# Patient Record
Sex: Female | Born: 1975 | Race: Black or African American | Hispanic: No | Marital: Single | Smoking: Never smoker
Health system: Southern US, Community
[De-identification: ages and names within clinical notes are randomized; demographics above are authoritative.]

## PROBLEM LIST (undated history)

## (undated) ENCOUNTER — Emergency Department (HOSPITAL_COMMUNITY): Admission: EM | Payer: BC Managed Care – PPO | Source: Home / Self Care

## (undated) DIAGNOSIS — J45909 Unspecified asthma, uncomplicated: Secondary | ICD-10-CM

## (undated) DIAGNOSIS — A6000 Herpesviral infection of urogenital system, unspecified: Secondary | ICD-10-CM

## (undated) DIAGNOSIS — I10 Essential (primary) hypertension: Secondary | ICD-10-CM

## (undated) HISTORY — DX: Herpesviral infection of urogenital system, unspecified: A60.00

## (undated) HISTORY — DX: Unspecified asthma, uncomplicated: J45.909

## (undated) HISTORY — DX: Essential (primary) hypertension: I10

---

## 1997-12-01 ENCOUNTER — Emergency Department (HOSPITAL_COMMUNITY): Admission: EM | Admit: 1997-12-01 | Discharge: 1997-12-01 | Payer: Self-pay | Admitting: Emergency Medicine

## 1997-12-11 ENCOUNTER — Emergency Department (HOSPITAL_COMMUNITY): Admission: EM | Admit: 1997-12-11 | Discharge: 1997-12-11 | Payer: Self-pay | Admitting: Emergency Medicine

## 1997-12-12 ENCOUNTER — Inpatient Hospital Stay (HOSPITAL_COMMUNITY): Admission: AD | Admit: 1997-12-12 | Discharge: 1997-12-14 | Payer: Self-pay | Admitting: Obstetrics and Gynecology

## 1998-07-20 ENCOUNTER — Encounter: Payer: Self-pay | Admitting: Emergency Medicine

## 1998-07-20 ENCOUNTER — Emergency Department (HOSPITAL_COMMUNITY): Admission: EM | Admit: 1998-07-20 | Discharge: 1998-07-20 | Payer: Self-pay | Admitting: Emergency Medicine

## 1999-02-24 ENCOUNTER — Inpatient Hospital Stay (HOSPITAL_COMMUNITY): Admission: AD | Admit: 1999-02-24 | Discharge: 1999-02-24 | Payer: Self-pay | Admitting: Obstetrics and Gynecology

## 1999-03-07 ENCOUNTER — Inpatient Hospital Stay (HOSPITAL_COMMUNITY): Admission: AD | Admit: 1999-03-07 | Discharge: 1999-03-07 | Payer: Self-pay | Admitting: Obstetrics and Gynecology

## 1999-07-04 HISTORY — PX: ECTOPIC PREGNANCY SURGERY: SHX613

## 1999-07-08 ENCOUNTER — Emergency Department (HOSPITAL_COMMUNITY): Admission: EM | Admit: 1999-07-08 | Discharge: 1999-07-08 | Payer: Self-pay | Admitting: Emergency Medicine

## 1999-08-26 ENCOUNTER — Observation Stay (HOSPITAL_COMMUNITY): Admission: AD | Admit: 1999-08-26 | Discharge: 1999-08-27 | Payer: Self-pay | Admitting: Obstetrics and Gynecology

## 1999-08-26 ENCOUNTER — Encounter: Payer: Self-pay | Admitting: Obstetrics and Gynecology

## 1999-12-06 ENCOUNTER — Inpatient Hospital Stay (HOSPITAL_COMMUNITY): Admission: AD | Admit: 1999-12-06 | Discharge: 1999-12-06 | Payer: Self-pay | Admitting: Obstetrics and Gynecology

## 2000-02-10 ENCOUNTER — Other Ambulatory Visit: Admission: RE | Admit: 2000-02-10 | Discharge: 2000-02-10 | Payer: Self-pay | Admitting: Obstetrics and Gynecology

## 2000-04-30 ENCOUNTER — Inpatient Hospital Stay (HOSPITAL_COMMUNITY): Admission: AD | Admit: 2000-04-30 | Discharge: 2000-04-30 | Payer: Self-pay | Admitting: Obstetrics and Gynecology

## 2001-06-10 ENCOUNTER — Other Ambulatory Visit: Admission: RE | Admit: 2001-06-10 | Discharge: 2001-06-10 | Payer: Self-pay | Admitting: Obstetrics and Gynecology

## 2001-07-03 HISTORY — PX: FOOT SURGERY: SHX648

## 2001-07-03 HISTORY — PX: BUNIONECTOMY: SHX129

## 2002-06-23 ENCOUNTER — Other Ambulatory Visit: Admission: RE | Admit: 2002-06-23 | Discharge: 2002-06-23 | Payer: Self-pay | Admitting: Obstetrics and Gynecology

## 2003-07-21 ENCOUNTER — Other Ambulatory Visit: Admission: RE | Admit: 2003-07-21 | Discharge: 2003-07-21 | Payer: Self-pay | Admitting: Obstetrics and Gynecology

## 2003-09-07 ENCOUNTER — Inpatient Hospital Stay (HOSPITAL_COMMUNITY): Admission: AD | Admit: 2003-09-07 | Discharge: 2003-09-07 | Payer: Self-pay | Admitting: Obstetrics & Gynecology

## 2004-07-25 ENCOUNTER — Other Ambulatory Visit: Admission: RE | Admit: 2004-07-25 | Discharge: 2004-07-25 | Payer: Self-pay | Admitting: Obstetrics and Gynecology

## 2005-03-01 ENCOUNTER — Emergency Department (HOSPITAL_COMMUNITY): Admission: EM | Admit: 2005-03-01 | Discharge: 2005-03-01 | Payer: Self-pay | Admitting: Emergency Medicine

## 2005-03-04 ENCOUNTER — Inpatient Hospital Stay (HOSPITAL_COMMUNITY): Admission: AD | Admit: 2005-03-04 | Discharge: 2005-03-04 | Payer: Self-pay | Admitting: Obstetrics and Gynecology

## 2005-03-18 ENCOUNTER — Encounter: Admission: RE | Admit: 2005-03-18 | Discharge: 2005-03-18 | Payer: Self-pay | Admitting: Internal Medicine

## 2007-08-28 ENCOUNTER — Emergency Department (HOSPITAL_COMMUNITY): Admission: EM | Admit: 2007-08-28 | Discharge: 2007-08-28 | Payer: Self-pay | Admitting: Emergency Medicine

## 2007-09-01 ENCOUNTER — Emergency Department (HOSPITAL_COMMUNITY): Admission: EM | Admit: 2007-09-01 | Discharge: 2007-09-01 | Payer: Self-pay | Admitting: Family Medicine

## 2008-11-19 ENCOUNTER — Emergency Department (HOSPITAL_COMMUNITY): Admission: EM | Admit: 2008-11-19 | Discharge: 2008-11-19 | Payer: Self-pay | Admitting: Family Medicine

## 2010-10-11 LAB — POCT I-STAT, CHEM 8
BUN: 16 mg/dL (ref 6–23)
Creatinine, Ser: 1 mg/dL (ref 0.4–1.2)
Potassium: 3.8 mEq/L (ref 3.5–5.1)
Sodium: 142 mEq/L (ref 135–145)

## 2010-10-11 LAB — POCT URINALYSIS DIP (DEVICE)
Glucose, UA: NEGATIVE mg/dL
Ketones, ur: NEGATIVE mg/dL
Specific Gravity, Urine: >=1.03 (ref 1.005–1.030)

## 2010-10-11 LAB — POCT PREGNANCY, URINE: Preg Test, Ur: NEGATIVE

## 2010-11-18 ENCOUNTER — Inpatient Hospital Stay (INDEPENDENT_AMBULATORY_CARE_PROVIDER_SITE_OTHER)
Admission: RE | Admit: 2010-11-18 | Discharge: 2010-11-18 | Disposition: A | Discharge status: Home / Self Care | Payer: Self-pay | Source: Ambulatory Visit | Attending: Emergency Medicine | Admitting: Emergency Medicine

## 2010-11-18 DIAGNOSIS — L03319 Cellulitis of trunk, unspecified: Secondary | ICD-10-CM

## 2010-11-18 NOTE — Op Note (Signed)
[facility]  Patient:    Katrina Callahan, Katrina Callahan                    MRN: [id number] Proc. Date: 08/26/99 Adm. Date:  [id number] Disc. Date: [id number] Attending:  Rhina Brackett                           Operative Report  PREOPERATIVE DIAGNOSIS:  Rule out ectopic pregnancy.  POSTOPERATIVE DIAGNOSIS:  Extensive pelvic adhesions with left tubal ectopic pregnancy.  OPERATION:  D&C.  Diagnostic laparoscopy, lysis of adhesions, left linear salpingostomy.  SURGEON:  Juluis Mire, M.D.  ASSISTANT:  ANESTHESIA:  General endotracheal.  ESTIMATED BLOOD LOSS:  Minimal.  PACKS AND DRAINS:  None.  INTRAOPERATIVE BLOOD REPLACED:  None.  COMPLICATIONS:  None.  INDICATIONS FOR PROCEDURE:  Dictated in the history and physical.  DESCRIPTION OF PROCEDURE:  The patient was taken to the operating room and placed in supine position.  After adequate level of general endotracheal anesthesia was obtained, the patient was put in the dorsal lithotomy position using the Allen stirrups.  The abdomen, perineum and vagina were prepped out with Betadine. Examination under anesthesia revealed the uterus to be retroverted, upper limits of normal size.  Adnexa unremarkable.  Speculum was placed in the vaginal vault. he cervix was grasped with a single-toothed tenaculum.  The uterus sounded to approximately 10 cm.  The cervix was serially dilated to a size 27 Pratt dilator. Curettings revealed minimal tissue.  We felt this was not consistent with an intrauterine pregnancy.  The Hulka tenaculum was put in place.  The speculum was then removed along with the single-toothed tenaculum.  The bladder was emptied ith in and out catheterization.  The patient was draped out for laparoscopy.  A subumbilical incision was made with a knife.  The Veress needle was introduced nto the abdominal cavity.  The abdomen was inflated to approximately 2L carbon dioxide. The operating  laparoscope was introduced.  There was no evidence of injury to adjacent organs.  A 5 mm trocar was then placed in the suprapubic area under visualization.  The cul-de-sac was completely obliterated with flimsy adhesions. The left ovary was densely adherent to the posterior aspect of the uterus.  The  left tube was tortuous but have an ectopic pregnancy somewhat in the isthmic portion.  The right tube was also obliterated and involved in adhesive process involving the right pelvic side wall and ovary.  The appendix was visualized and noted to be normal.  ____________ abdomen including liver tip and the gallbladder were clear.  A third trocar was put in place in the left lower quadrant after visualization of the epigastric vessels.  Next a dilute solution of Pitressin saline was injected into the antimesenteric border of the left fallopian tube and over the ectopic pregnancy.  We allowed this to settle.  We then brought in the YAG laser with the pointed sapphire tip.  I believe the number is the GP6.  At 15 watt continuous mode, we did lyse adhesions between the posterior aspect of the uterus and the cul-de-sac.  Eventually, the cul-de-sac was free from the flimsy adhesions and this did help elevate the uterus. We then turned to the right fallopian tube. Using again the laser, we were able free up the right tube and ovary.  The tube was completely obstructed at its distal end with no viable fimbria.  We then went back  and freed up the ovary from the side from the posterior aspect of the uterus. t did hang freely.  We then proceeded with a linear salpingostomy.  An incision was made over the ectopic and the products of conception extruded including the apparent gestational sac.  This was removed and sent for pathological review. e further dissected the placental tissue free.  Some oozing was brought under control with the bipolar.  We thoroughly irrigated.  We had good hemostasis  and apparent complete removal of the ectopic pregnancy.  We had no active bleeding.  The abdomen was deflated of its carbon dioxide.  All trocars were removed.  Subumbilical incision was closed with interrupted subcuticulars of 4-0 Vicryl.  The suprapubic incision was closed with Steri-Strips.  The Hulka tenaculum was then removed. Sponge, needle and instrument counts were reported as correct by the circulating nurse x 2.  The patient tolerated the procedure well and once extubated, transferred to the recovery room in good condition. DD:  08/26/99 TD:  08/28/99 Job: [id number]

## 2010-11-18 NOTE — H&P (Signed)
[facility]  Patient:    Katrina Callahan, Katrina Callahan                    MRN: [id number] Adm. Date:  [id number] Attending:  Rhina Brackett                         History and Physical  HISTORY OF PRESENT ILLNESS:   [age]-year-old gravida 1, para 0, black female, last menstrual period of June 25, 1999, giving her an estimated date of confinement of March 30, 2000; this gives her an estimated gestational age f 9 weeks.  She presents at the present time to rule out ectopic pregnancy.  In relation to the present admission, the patient was seen in our office on August 24, 1999 complaining of brownish discharge.  Ultrasound at that time failed to reveal an intrauterine pregnancy and there was a concern about an ectopic.  Initial quantitative level was 23,008.  We gave her ectopic precautions and repeated her quantitative level today; it has risen only slightly to 23,382. Followup ultrasound done in the triage did present the issue of possible ectopic pregnancy involving the left fallopian tube.  In view of this, the patient is set up now to undergo D&C with frozen section and possible laparoscopy with removal of ectopic pregnancy and/or fallopian tube.  ALLERGIES:                    Allergic to PENICILLIN and ERYTHROMYCIN.  MEDICATIONS:                  Prenatal vitamins; she also uses asthmatic inhalers.  PAST MEDICAL HISTORY:         Usual childhood diseases.  She does have a history of pelvic inflammatory disease, treated in the past, and it did require hospitalization.  Also had previous history of asthma, for which she is on minimal medications.  PAST SURGICAL HISTORY:        She has no previous surgical history.  OBSTETRICAL HISTORY:          She has no previous obstetrical history.  FAMILY HISTORY:               Noncontributory.  SOCIAL HISTORY:               No tobacco or alcohol use.  REVIEW OF SYSTEMS:             Noncontributory.  PHYSICAL EXAMINATION:  VITAL SIGNS:                  Patient is afebrile with stable vital signs.  HEENT:                        Patient is normocephalic.  Pupils are equal, round and reactive to light and accommodation.  Extraocular movements are intact. Sclerae and conjunctivae clear.  Oropharynx clear.  NECK:                         Without thyromegaly.  BREASTS:                      No discrete masses.  LUNGS:                        Clear.  CARDIAC:  Regular rhythm and rate without murmurs or gallops.  ABDOMEN:                      Benign.  No mass, organomegaly or tenderness.  PELVIC:                       Normal external genitalia.  Vaginal mucosa is clear. Cervix is clear.  Moderate bleeding is noted.  Uterus mid-to-posterior, upper limits of normal size.  Bilateral adnexal tenderness.  No fullness appreciated.   EXTREMITIES:                  Trace edema.  NEUROLOGIC:                   Grossly within normal limits.  BASIC IMPRESSION:             Rule out ectopic pregnancy.  PLAN:                         The patient will undergo D&C and possible frozen section with subsequent diagnostic laparoscopy with laser standby and possible removal of ectopic pregnancy and possible removal of fallopian tube.  The risks of surgery have been discussed including anesthetic concerns, the risk of infection, the risk of hemorrhage that could necessitate transfusion with the risk of AIDS or hepatitis, the risk of injury to adjacent organs that could require exploratory  surgery for management, risks of deep venous thrombosis and pulmonary emboli. Patient expressed understanding of indications and risks. DD:  08/26/99 TD:  08/27/99 Job: [id number]

## 2010-11-18 NOTE — Discharge Summary (Signed)
[facility]  Patient:    Katrina Callahan, Katrina Callahan                    MRN: [id number] Adm. Date:  [id number] Disc. Date: [id number] Attending:  Rhina Brackett                           Discharge Summary  MAIN DIAGNOSIS:               Rule out ectopic pregnancy.  DISCHARGE DIAGNOSES:          1. Left tubal ectopic pregnancy.                               2. Extensive pelvic adhesions.  PROCEDURE:                    D&C, diagnostic laparoscopy, lysis of adhesions, nd linear salpingostomy.  HISTORY OF PRESENT ILLNESS:   For complete history please see dictated note.  COURSE IN HOSPITAL:           Patient underwent D&C, laparoscopy, finding of extensive pelvic adhesions.  The ectopic involved the left tube and was removed by linear salpingostomy.  She had basically complete obstruction of the right fallopian tube.  _________ were taken down.  This was an extensive procedure. This evidently came from _________ or pelvic inflammatory disease.  Appendix, liver, and gallbladder were all clear.  Postoperatively she did well. Postoperative hemoglobin was 10.8, postoperative quantitated level had dropped o 9568.  Her blood type was O positive.  She was discharged home.  At that time she was afebrile, with abdomen soft and nontender.  Incision was clear.  No active vaginal bleeding.  There were no complications, _________ stay in the hospital.  She was discharged home in stable condition to physician.  Continued postoperative management was discussed.  We discussed the potential for persistent ectopic pregnancies and need for follow-up quantitative levels.  Ectopic precautions are again emphasized. he would call with increasing pain, nausea, vomiting, or fever.  DISPOSITION:                  Discharged home with Tylox as needed as for pain.  Reassess in the office on Monday. DD:  08/27/99 TD:  08/28/99 Job: [id number]

## 2011-03-24 LAB — CULTURE, ROUTINE-ABSCESS

## 2011-10-06 LAB — HM PAP SMEAR: HM Pap smear: NEGATIVE

## 2012-01-05 ENCOUNTER — Ambulatory Visit (INDEPENDENT_AMBULATORY_CARE_PROVIDER_SITE_OTHER): Payer: BC Managed Care – PPO | Admitting: Family Medicine

## 2012-01-05 ENCOUNTER — Encounter: Payer: Self-pay | Admitting: Family Medicine

## 2012-01-05 VITALS — BP 128/88 | HR 72 | Temp 98.0°F | Resp 12 | Ht 66.0 in | Wt 241.0 lb

## 2012-01-05 DIAGNOSIS — Z91030 Bee allergy status: Secondary | ICD-10-CM | POA: Insufficient documentation

## 2012-01-05 DIAGNOSIS — B009 Herpesviral infection, unspecified: Secondary | ICD-10-CM

## 2012-01-05 DIAGNOSIS — Z0000 Encounter for general adult medical examination without abnormal findings: Secondary | ICD-10-CM

## 2012-01-05 DIAGNOSIS — T7840XA Allergy, unspecified, initial encounter: Secondary | ICD-10-CM

## 2012-01-05 DIAGNOSIS — E669 Obesity, unspecified: Secondary | ICD-10-CM

## 2012-01-05 MED ORDER — EPINEPHRINE 0.3 MG/0.3ML IJ DEVI: 0.3000 mg | Freq: Once | INTRAMUSCULAR | Status: AC

## 2012-01-05 NOTE — Progress Notes (Signed)
  Subjective:    Patient ID: Katrina Callahan, female    DOB: 02-Jan-1976, 36 y.o.   MRN: [id number]  HPI  Patient seen to establish care. Also requesting wellness exam. She sees gynecologist for Pap smears and breast exams. Past medical history reviewed. Question of mild intermittent asthma. She has not used any inhalers regularly. She has history of genital herpes. History of reported generalized allergic reaction to bumblebee stain. Requesting an EpiPen.  Surgical history significant for ectopic pregnancy 2001. Bilateral bunion surgery 2003. Family history significant for mother with gastric cancer. Father prostate cancer. Several members with hypertension.  Patient is single. Nonsmoker. Rare alcohol use. No children.  Just recently started exercise classes. Hopes to lose some weight. 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  HENT: Negative for hearing loss, ear pain, sore throat and trouble swallowing.   Eyes: Negative for visual disturbance.  Respiratory: Negative for cough and shortness of breath.   Cardiovascular: Negative for chest pain and palpitations.  Gastrointestinal: Negative for abdominal pain, diarrhea, constipation and blood in stool.  Genitourinary: Negative for dysuria and hematuria.  Musculoskeletal: Negative for myalgias, back pain and arthralgias.  Skin: Negative for rash.  Neurological: Negative for dizziness, syncope and headaches.  Hematological: Negative for adenopathy.  Psychiatric/Behavioral: Negative for confusion and dysphoric mood.       Objective:   Physical Exam  Constitutional: She is oriented to person, place, and time. She appears well-developed and well-nourished.  HENT:  Head: Normocephalic and atraumatic.  Eyes: EOM are normal. Pupils are equal, round, and reactive to light.  Neck: Normal range of  motion. Neck supple. No thyromegaly present.  Cardiovascular: Normal rate, regular rhythm and normal heart sounds.   No murmur heard. Pulmonary/Chest: Breath sounds normal. No respiratory distress. She has no wheezes. She has no rales.  Abdominal: Soft. Bowel sounds are normal. She exhibits no distension and no mass. There is no tenderness. There is no rebound and no guarding.  Musculoskeletal: Normal range of motion. She exhibits no edema.  Lymphadenopathy:    She has no cervical adenopathy.  Neurological: She is alert and oriented to person, place, and time. She displays normal reflexes. No cranial nerve deficit.  Skin: No rash noted.  Psychiatric: She has a normal mood and affect. Her behavior is normal. Judgment and thought content normal.          Assessment & Plan:  Wellness exam. Discussed diet and exercise for weight loss. Screening lab work ordered. Epipen refill. Confirm date of last tetanus

## 2012-01-05 NOTE — Patient Instructions (Addendum)
Check to see if you have had Tdap in past 7-8 years.

## 2012-01-08 ENCOUNTER — Other Ambulatory Visit: Payer: BC Managed Care – PPO

## 2012-01-08 ENCOUNTER — Other Ambulatory Visit (INDEPENDENT_AMBULATORY_CARE_PROVIDER_SITE_OTHER): Payer: BC Managed Care – PPO

## 2012-01-08 DIAGNOSIS — Z0000 Encounter for general adult medical examination without abnormal findings: Secondary | ICD-10-CM

## 2012-01-08 LAB — LIPID PANEL
Cholesterol: 160 mg/dL (ref 0–200)
HDL: 63.8 mg/dL (ref >39.00)
Total CHOL/HDL Ratio: 3
Triglycerides: 94 mg/dL (ref 0.0–149.0)

## 2012-01-08 LAB — CBC WITH DIFFERENTIAL/PLATELET
Basophils Relative: 0.3 % (ref 0.0–3.0)
Eosinophils Absolute: 0.1 10*3/uL (ref 0.0–0.7)
Lymphocytes Relative: 34.6 % (ref 12.0–46.0)
MCHC: 32.7 g/dL (ref 30.0–36.0)
Neutrophils Relative %: 58.5 % (ref 43.0–77.0)
RBC: 4.27 Mil/uL (ref 3.87–5.11)
WBC: 7 10*3/uL (ref 4.5–10.5)

## 2012-01-08 LAB — HEPATIC FUNCTION PANEL
ALT: 13 U/L (ref 0–35)
AST: 12 U/L (ref 0–37)
Albumin: 3.6 g/dL (ref 3.5–5.2)
Total Protein: 6.9 g/dL (ref 6.0–8.3)

## 2012-01-08 LAB — BASIC METABOLIC PANEL
Calcium: 8.8 mg/dL (ref 8.4–10.5)
Creatinine, Ser: 0.9 mg/dL (ref 0.4–1.2)

## 2012-01-08 LAB — TSH: TSH: 1.39 u[IU]/mL (ref 0.35–5.50)

## 2012-05-07 ENCOUNTER — Ambulatory Visit (INDEPENDENT_AMBULATORY_CARE_PROVIDER_SITE_OTHER): Payer: BC Managed Care – PPO | Admitting: Family Medicine

## 2012-05-07 ENCOUNTER — Encounter: Payer: Self-pay | Admitting: Family Medicine

## 2012-05-07 VITALS — BP 120/82 | HR 78 | Temp 98.4°F | Wt 236.0 lb

## 2012-05-07 DIAGNOSIS — J309 Allergic rhinitis, unspecified: Secondary | ICD-10-CM

## 2012-05-07 DIAGNOSIS — J069 Acute upper respiratory infection, unspecified: Secondary | ICD-10-CM

## 2012-05-07 MED ORDER — FLUTICASONE PROPIONATE 50 MCG/ACT NA SUSP: 2.0000 | Freq: Every day | NASAL | Status: DC

## 2012-05-07 MED ORDER — GUAIFENESIN-CODEINE 100-10 MG/5ML PO SYRP: 5.0000 mL | ORAL_SOLUTION | Freq: Three times a day (TID) | ORAL | Status: DC | PRN

## 2012-05-07 MED ORDER — ALBUTEROL SULFATE HFA 108 (90 BASE) MCG/ACT IN AERS: 2.0000 | INHALATION_SPRAY | RESPIRATORY_TRACT | Status: DC | PRN

## 2012-05-07 NOTE — Patient Instructions (Addendum)
INSTRUCTIONS FOR UPPER RESPIRATORY INFECTION:  -plenty of rest and fluids  -nasal saline wash 2-3 times daily (use prepackaged nasal saline or bottled/distilled water if making your own)   -clean nose with nasal saline before using the nasal steroid   -can use tylenol or ibuprofen as directed for aches and sorethroat  -in the winter time, using a humidifier at night is helpful (please follow cleaning instructions)  -if you are taking a cough medication - use only as directed, may also try a teaspoon of honey to coat the throat and throat lozenges  -for sore throat, salt water gargles can help  -follow up if you have fevers, are worsening or not getting better in 5-7 days  -regardless follow up with your doctor in one month for allergic rhinitis

## 2012-05-07 NOTE — Progress Notes (Signed)
Chief Complaint  Patient presents with  . Follow-up    cough and congestion- worse at night     HPI: -started: 1 week ago -symptoms:nasal congestion, sore throat, cough, swollen glands in neck - feels mild SOB when coughs a lot -denies:fever, SOB, NVD, tooth pain -has tried: orange juice, musinex DN -sick contacts: none known -Hx of: childhood asthma - no attacks or albuterol in many years, has issues with seasonal allergies  ROS: See pertinent positives and negatives per HPI.  Past Medical History  Diagnosis Date  . Asthma   . Genital herpes     Family History  Problem Relation Age of Onset  . Hyperlipidemia Mother   . Hypertension Mother   . Cancer Mother     stomach  . Cancer Father     prostate  . Hyperlipidemia Father   . Hypertension Father   . Hypertension Sister   . Hypertension Brother   . Hypertension Maternal Grandmother   . Hyperlipidemia Maternal Grandmother   . Hypertension Maternal Grandfather   . Hyperlipidemia Maternal Grandfather   . Hypertension Paternal Grandmother   . Hyperlipidemia Paternal Grandmother   . Hypertension Paternal Grandfather   . Hyperlipidemia Paternal Grandfather     History   Social History  . Marital Status: Single    Spouse Name: N/A    Number of Children: N/A  . Years of Education: N/A   Social History Main Topics  . Smoking status: Never Smoker   . Smokeless tobacco: None  . Alcohol Use: None  . Drug Use: None  . Sexually Active: None   Other Topics Concern  . None   Social History Narrative  . None    Current outpatient prescriptions:CRYSELLE-28 0.3-30 MG-MCG tablet, Take 1 tablet by mouth daily. Per Dr Arelia Sneddon, ob/gyn, Disp: , Rfl: ;  EPINEPHrine (EPIPEN) 0.3 mg/0.3 mL DEVI, Inject 0.3 mLs (0.3 mg total) into the muscle once., Disp: 1 Device, Rfl: 2;  albuterol (PROVENTIL HFA;VENTOLIN HFA) 108 (90 BASE) MCG/ACT inhaler, Inhale 2 puffs into the lungs every 4 (four) hours as needed for wheezing., Disp: 1  Inhaler, Rfl: 0 fluticasone (FLONASE) 50 MCG/ACT nasal spray, Place 2 sprays into the nose daily., Disp: 16 g, Rfl: 0;  guaiFENesin-codeine (CHERATUSSIN AC) 100-10 MG/5ML syrup, Take 5 mLs by mouth 3 (three) times daily as needed for cough., Disp: 120 mL, Rfl: 0  EXAM:  Filed Vitals:   05/07/12 1421  BP: 120/82  Pulse: 78  Temp: 98.4 F (36.9 C)    There is no height on file to calculate BMI.  GENERAL: vitals reviewed and listed above, alert, oriented, appears well hydrated and in no acute distress  HEENT: atraumatic, conjunttiva clear, no obvious abnormalities on inspection of external nose and ears, normal appearance of ear canals and TMs, clear nasal congestion, pale boggy enlarged turbinates, mild post oropharyngeal erythema with PND, no tonsillar edema or exudate, no sinus TTP  NECK: no obvious masses on inspection  LUNGS: clear to auscultation bilaterally, no wheezes, rales or rhonchi, good air movement  CV: HRRR, no peripheral edema  MS: moves all extremities without noticeable abnormality  PSYCH: pleasant and cooperative, no obvious depression or anxiety  ASSESSMENT AND PLAN:  Discussed the following assessment and plan:  1. Upper respiratory infection  guaiFENesin-codeine (CHERATUSSIN AC) 100-10 MG/5ML syrup, fluticasone (FLONASE) 50 MCG/ACT nasal spray, albuterol (PROVENTIL HFA;VENTOLIN HFA) 108 (90 BASE) MCG/ACT inhaler  2. Allergic rhinitis  fluticasone (FLONASE) 50 MCG/ACT nasal spray   -symptoms and exam c/w  VURI - normal lung exam, but given hx will give alb to use as has obvious AR on exam and hx of childhood asthma and could have some mild bronchial involvement with current infection -Patient advised to return or notify a doctor immediately if symptoms worsen or persist or new concerns arise.  Patient Instructions  INSTRUCTIONS FOR UPPER RESPIRATORY INFECTION:  -plenty of rest and fluids  -nasal saline wash 2-3 times daily (use prepackaged nasal saline or  bottled/distilled water if making your own)   -clean nose with nasal saline before using the nasal steroid or sinex  -can use sinex nasal spray for drainage and nasal congestion - but do NOT use longer then 3-4 days  -can use tylenol or ibuprofen as directed for aches and sorethroat  -in the winter time, using a humidifier at night is helpful (please follow cleaning instructions)  -if you are taking a cough medication - use only as directed, may also try a teaspoon of honey to coat the throat and throat lozenges  -for sore throat, salt water gargles can help  -follow up if you have fevers, are worsening or not getting better in 5-7 days      Roni Friberg R.

## 2012-11-05 ENCOUNTER — Ambulatory Visit (INDEPENDENT_AMBULATORY_CARE_PROVIDER_SITE_OTHER): Payer: BC Managed Care – PPO | Admitting: Family Medicine

## 2012-11-05 ENCOUNTER — Encounter: Payer: Self-pay | Admitting: Family Medicine

## 2012-11-05 VITALS — BP 130/90 | Temp 98.7°F | Wt 241.0 lb

## 2012-11-05 DIAGNOSIS — R030 Elevated blood-pressure reading, without diagnosis of hypertension: Secondary | ICD-10-CM

## 2012-11-05 DIAGNOSIS — R7303 Prediabetes: Secondary | ICD-10-CM

## 2012-11-05 DIAGNOSIS — R7309 Other abnormal glucose: Secondary | ICD-10-CM

## 2012-11-05 DIAGNOSIS — E669 Obesity, unspecified: Secondary | ICD-10-CM

## 2012-11-05 NOTE — Progress Notes (Signed)
  Subjective:    Patient ID: Katrina Callahan, female    DOB: 03-01-1976, 37 y.o.   MRN: [id number]  HPI  Patient here to discuss weight loss strategies. Her weight is actually unchanged compared to last July. She states she lost up to 30 pounds last winter but has gained most of that weight back. She's been heavy most of her life. She had normal TSH last summer.  Patient relates that she was in for a routine eye check and was noted to have papilledema. She was referred to ophthalmologist and apparently diagnosed pseudotumor cerebra . She was started on Diamox.  She does not recall diagnosis of glaucoma. Ophthalmologist strongly suggested weight loss as part of her treatment.  Patient has previously tried Weight Watchers and LA weight loss with only minimal success. She has history of borderline elevated blood pressure. No regular nonsteroidals. No alcohol use. Other comorbidities include history prediabetes. She does not recall seeing nutritionist in the past. Just recently started exercising with cycling couple days per week. No history of consistent exercise  She has strong family history of hypertension.  Past Medical History  Diagnosis Date  . Asthma   . Genital herpes    Past Surgical History  Procedure Laterality Date  . Ectopic pregnancy surgery  2001  . Foot surgery  2003    both bunion and hammertoe  . Ectopic pregnancy surgery  2001  . Bunionectomy  2003    reports that she has never smoked. She does not have any smokeless tobacco history on file. Her alcohol and drug histories are not on file. family history includes Cancer in her father and mother; Hyperlipidemia in her father, maternal grandfather, maternal grandmother, mother, paternal grandfather, and paternal grandmother; and Hypertension in her brother, father, maternal grandfather, maternal grandmother, mother, paternal grandfather, paternal grandmother, and sister. Allergies  Allergen Reactions  . Bee Venom    Trouble breathing  . Erythromycin Shortness Of Breath  . Penicillins     anaphlysis     Review of Systems  Constitutional: Negative for fatigue.  Eyes: Negative for visual disturbance.  Respiratory: Negative for cough, chest tightness, shortness of breath and wheezing.   Cardiovascular: Negative for chest pain, palpitations and leg swelling.  Neurological: Negative for dizziness, seizures, syncope, weakness, light-headedness and headaches.       Objective:   Physical Exam  Constitutional: She appears well-developed and well-nourished.  Eyes: Pupils are equal, round, and reactive to light.  Neck: Neck supple. No thyromegaly present.  Cardiovascular: Normal rate and regular rhythm.  Exam reveals no gallop.   No murmur heard. Pulmonary/Chest: Effort normal and breath sounds normal. No respiratory distress. She has no wheezes. She has no rales.  Musculoskeletal: She exhibits no edema.          Assessment & Plan:  Obesity. Patient has other issues including elevated blood pressure which is borderline elevated and prediabetes. She has what sounds like pseudotumor cerebri by recent ophthalmology assessment and reported MRI scan.   Set up nutrition referral. Send for records from ophthalmologist to clarify what he is treating her for. We discussed at some length weight loss strategies both with dietary means and exercise.

## 2012-11-05 NOTE — Patient Instructions (Addendum)
Calorie Counting Diet A calorie counting diet requires you to eat the number of calories that are right for you in a day. Calories are the measurement of how much energy you get from the food you eat. Eating the right amount of calories is important for staying at a healthy weight. If you eat too many calories, your body will store them as fat and you may gain weight. If you eat too few calories, you may lose weight. Counting the number of calories you eat during a day will help you know if you are eating the right amount. A Registered Dietitian can determine how many calories you need in a day. The amount of calories needed varies from person to person. If your goal is to lose weight, you will need to eat fewer calories. Losing weight can benefit you if you are overweight or have health problems such as heart disease, high blood pressure, or diabetes. If your goal is to gain weight, you will need to eat more calories. Gaining weight may be necessary if you have a certain health problem that causes your body to need more energy. TIPS Whether you are increasing or decreasing the number of calories you eat during a day, it may be hard to get used to changes in what you eat and drink. The following are tips to help you keep track of the number of calories you eat. Measure foods at home with measuring cups. This helps you know the amount of food and number of calories you are eating. Restaurants often serve food in amounts that are larger than 1 serving. While eating out, estimate how many servings of a food you are given. For example, a serving of cooked rice is  cup or about the size of half of a fist. Knowing serving sizes will help you be aware of how much food you are eating at restaurants. Ask for smaller portion sizes or child-size portions at restaurants. Plan to eat half of a meal at a restaurant. Take the rest home or share the other half with a friend. Read the Nutrition Facts panel on food labels  for calorie content and serving size. You can find out how many servings are in a package, the size of a serving, and the number of calories each serving has. For example, a package might contain 3 cookies. The Nutrition Facts panel on that package says that 1 serving is 1 cookie. Below that, it will say there are 3 servings in the container. The calories section of the Nutrition Facts label says there are 90 calories. This means there are 90 calories in 1 cookie (1 serving). If you eat 1 cookie you have eaten 90 calories. If you eat all 3 cookies, you have eaten 270 calories (3 servings x 90 calories = 270 calories). The list below tells you how big or small some common portion sizes are. 1 oz.........4 stacked dice. 3 oz........Marland Kitchen.Deck of cards. 1 tsp.......Marland Kitchen.Tip of little finger. 1 tbs......Marland Kitchen.Marland Kitchen.Thumb. 2 tbs.......Marland Kitchen.Golf ball.  cup......Marland Kitchen.Half of a fist. 1 cup.......Marland Kitchen.A fist. KEEP A FOOD LOG Write down every food item you eat, the amount you eat, and the number of calories in each food you eat during the day. At the end of the day, you can add up the total number of calories you have eaten. It may help to keep a list like the one below. Find out the calorie information by reading the Nutrition Facts panel on food labels. Breakfast Bran cereal (1 cup, 110 calories).  Fat-free milk ( cup, 45 calories). Snack Apple (1 medium, 80 calories). Lunch Spinach (1 cup, 20 calories). Tomato ( medium, 20 calories). Chicken breast strips (3 oz, 165 calories). Shredded cheddar cheese ( cup, 110 calories). Light [place] dressing (2 tbs, 60 calories). Whole-wheat bread (1 slice, 80 calories). Tub margarine (1 tsp, 35 calories). Vegetable soup (1 cup, 160 calories). Dinner Pork chop (3 oz, 190 calories). Brown rice (1 cup, 215 calories). Steamed broccoli ( cup, 20 calories). Strawberries (1  cup, 65 calories). Whipped cream (1 tbs, 50 calories). Daily Calorie Total: 1425 Document Released: 06/19/2005  Document Revised: 09/11/2011 Document Reviewed: 12/14/2006 [facility] Patient Information 2013 [place], [place].  Exercise to Lose Weight Exercise and a healthy diet may help you lose weight. Your doctor may suggest specific exercises. EXERCISE IDEAS AND TIPS  Choose low-cost things you enjoy doing, such as walking, bicycling, or exercising to workout videos.  Take stairs instead of the elevator.  Walk during your lunch break.  Park your car further away from work or school.  Go to a gym or an exercise class.  Start with 5 to 10 minutes of exercise each day. Build up to 30 minutes of exercise 4 to 6 days a week.  Wear shoes with good support and comfortable clothes.  Stretch before and after working out.  Work out until you breathe harder and your heart beats faster.  Drink extra water when you exercise.  Do not do so much that you hurt yourself, feel dizzy, or get very short of breath. Exercises that burn about 150 calories:  Running 1  miles in 15 minutes.  Playing volleyball for 45 to 60 minutes.  Washing and waxing a car for 45 to 60 minutes.  Playing touch football for 45 minutes.  Walking 1  miles in 35 minutes.  Pushing a stroller 1  miles in 30 minutes.  Playing basketball for 30 minutes.  Raking leaves for 30 minutes.  Bicycling 5 miles in 30 minutes.  Walking 2 miles in 30 minutes.  Dancing for 30 minutes.  Shoveling snow for 15 minutes.  Swimming laps for 20 minutes.  Walking up stairs for 15 minutes.  Bicycling 4 miles in 15 minutes.  Gardening for 30 to 45 minutes.  Jumping rope for 15 minutes.  Washing windows or floors for 45 to 60 minutes. Document Released: 07/22/2010 Document Revised: 09/11/2011 Document Reviewed: 07/22/2010 [facility] Patient Information 2013 [place], [place].  We will call you regarding nutrition referral.

## 2014-05-08 ENCOUNTER — Encounter: Payer: Self-pay | Admitting: Family Medicine

## 2014-05-08 ENCOUNTER — Encounter: Payer: Self-pay | Admitting: *Deleted

## 2014-05-08 ENCOUNTER — Ambulatory Visit (INDEPENDENT_AMBULATORY_CARE_PROVIDER_SITE_OTHER): Payer: BC Managed Care – PPO | Admitting: Family Medicine

## 2014-05-08 VITALS — BP 118/80 | HR 79 | Temp 99.0°F | Ht 66.0 in | Wt 254.5 lb

## 2014-05-08 DIAGNOSIS — K529 Noninfective gastroenteritis and colitis, unspecified: Secondary | ICD-10-CM

## 2014-05-08 MED ORDER — ONDANSETRON 4 MG PO TBDP: 4.0000 mg | ORAL_TABLET | Freq: Three times a day (TID) | ORAL | Status: DC | PRN

## 2014-05-08 NOTE — Patient Instructions (Signed)
BEFORE YOU LEAVE: -work note - return to work after diarrhea and vomiting resolved for 24 hours  -consider discussing with your dentist to try different antibiotic  Loperamide for diarrhea per instructions  zofran per instructions if needed  Viral Gastroenteritis Viral gastroenteritis is also known as stomach flu. This condition affects the stomach and intestinal tract. It can cause sudden diarrhea and vomiting. The illness typically lasts 3 to 8 days. Most people develop an immune response that eventually gets rid of the virus. While this natural response develops, the virus can make you quite ill. CAUSES  Many different viruses can cause gastroenteritis, such as rotavirus or noroviruses. You can catch one of these viruses by consuming contaminated food or water. You may also catch a virus by sharing utensils or other personal items with an infected person or by touching a contaminated surface. SYMPTOMS  The most common symptoms are diarrhea and vomiting. These problems can cause a severe loss of body fluids (dehydration) and a body salt (electrolyte) imbalance. Other symptoms may include:  Fever.  Headache.  Fatigue.  Abdominal pain. DIAGNOSIS  Your caregiver can usually diagnose viral gastroenteritis based on your symptoms and a physical exam. A stool sample may also be taken to test for the presence of viruses or other infections. TREATMENT  This illness typically goes away on its own. Treatments are aimed at rehydration. The most serious cases of viral gastroenteritis involve vomiting so severely that you are not able to keep fluids down. In these cases, fluids must be given through an intravenous line (IV). HOME CARE INSTRUCTIONS   Drink enough fluids to keep your urine clear or pale yellow. Drink small amounts of fluids frequently and increase the amounts as tolerated.  Ask your caregiver for specific rehydration instructions.  Avoid:  Foods high in  sugar.  Alcohol.  Carbonated drinks.  Tobacco.  Juice.  Caffeine drinks.  Extremely hot or cold fluids.  Fatty, greasy foods.  Too much intake of anything at one time.  Dairy products until 24 to 48 hours after diarrhea stops.  You may consume probiotics. Probiotics are active cultures of beneficial bacteria. They may lessen the amount and number of diarrheal stools in adults. Probiotics can be found in yogurt with active cultures and in supplements.  Wash your hands well to avoid spreading the virus.  Only take over-the-counter or prescription medicines for pain, discomfort, or fever as directed by your caregiver. Do not give aspirin to children. Antidiarrheal medicines are not recommended.  Ask your caregiver if you should continue to take your regular prescribed and over-the-counter medicines.  Keep all follow-up appointments as directed by your caregiver. SEEK IMMEDIATE MEDICAL CARE IF:   You are unable to keep fluids down.  You do not urinate at least once every 6 to 8 hours.  You develop shortness of breath.  You notice blood in your stool or vomit. This may look like coffee grounds.  You have abdominal pain that increases or is concentrated in one small area (localized).  You have persistent vomiting or diarrhea.  You have a fever.  The patient is a child younger than 3 months, and he or she has a fever.  The patient is a child older than 3 months, and he or she has a fever and persistent symptoms.  The patient is a child older than 3 months, and he or she has a fever and symptoms suddenly get worse.  The patient is a baby, and he or she has no  tears when crying. MAKE SURE YOU:   Understand these instructions.  Will watch your condition.  Will get help right away if you are not doing well or get worse. Document Released: 06/19/2005 Document Revised: 09/11/2011 Document Reviewed: 04/05/2011 [facility] Patient Information 2015 [place], [place]. This  information is not intended to replace advice given to you by your health care provider. Make sure you discuss any questions you have with your health care provider.

## 2014-05-08 NOTE — Progress Notes (Signed)
Pre visit review using our clinic review tool, if applicable. No additional management support is needed unless otherwise documented below in the visit note. 

## 2014-05-08 NOTE — Progress Notes (Signed)
  HPI:  Nausea/vomiting/Diarrhea: -started today -several episodes of watery diarrhea today, nausea -denies: fevers, hematemesis, hematochezia, inability to tolerate po fluids, body aches, upper resp symptoms -on abx for tooth -student with gastroenteritis -FDLMP: now -denies recent travel  ROS: See pertinent positives and negatives per HPI.  Past Medical History  Diagnosis Date  . Asthma   . Genital herpes     Past Surgical History  Procedure Laterality Date  . Ectopic pregnancy surgery  2001  . Foot surgery  2003    both bunion and hammertoe  . Ectopic pregnancy surgery  2001  . Bunionectomy  2003    Family History  Problem Relation Age of Onset  . Hyperlipidemia Mother   . Hypertension Mother   . Cancer Mother     stomach  . Cancer Father     prostate  . Hyperlipidemia Father   . Hypertension Father   . Hypertension Sister   . Hypertension Brother   . Hypertension Maternal Grandmother   . Hyperlipidemia Maternal Grandmother   . Hypertension Maternal Grandfather   . Hyperlipidemia Maternal Grandfather   . Hypertension Paternal Grandmother   . Hyperlipidemia Paternal Grandmother   . Hypertension Paternal Grandfather   . Hyperlipidemia Paternal Grandfather     History   Social History  . Marital Status: Single    Spouse Name: N/A    Number of Children: N/A  . Years of Education: N/A   Social History Main Topics  . Smoking status: Never Smoker   . Smokeless tobacco: None  . Alcohol Use: None  . Drug Use: None  . Sexual Activity: None   Other Topics Concern  . None   Social History Narrative    Current outpatient prescriptions: albuterol (PROVENTIL HFA;VENTOLIN HFA) 108 (90 BASE) MCG/ACT inhaler, Inhale 2 puffs into the lungs every 4 (four) hours as needed for wheezing., Disp: 1 Inhaler, Rfl: 0;  EPINEPHrine (EPIPEN) 0.3 mg/0.3 mL DEVI, Inject 0.3 mLs (0.3 mg total) into the muscle once., Disp: 1 Device, Rfl: 2;  metroNIDAZOLE (FLAGYL) 250 MG  tablet, Take 250 mg by mouth 3 (three) times daily. Prep for upcoming dental work, Disp: , Rfl:  ondansetron (ZOFRAN ODT) 4 MG disintegrating tablet, Take 1 tablet (4 mg total) by mouth every 8 (eight) hours as needed for nausea or vomiting., Disp: 20 tablet, Rfl: 0  EXAM:  Filed Vitals:   05/08/14 1428  BP: 118/80  Pulse: 79  Temp: 99 F (37.2 C)    Body mass index is 41.1 kg/(m^2).  GENERAL: vitals reviewed and listed above, alert, oriented, appears well hydrated and in no acute distress  HEENT: atraumatic, conjunttiva clear, no obvious abnormalities on inspection of external nose and ears  NECK: no obvious masses on inspection  LUNGS: clear to auscultation bilaterally, no wheezes, rales or rhonchi, good air movement  CV: HRRR, no peripheral edema  ABD: BS+, soft, NTTP  MS: moves all extremities without noticeable abnormality  PSYCH: pleasant and cooperative, no obvious depression or anxiety  ASSESSMENT AND PLAN:  Discussed the following assessment and plan:  Gastroenteritis - Plan: ondansetron (ZOFRAN ODT) 4 MG disintegrating tablet  -vs reaction to medication - advised she discuss switching abx with her dentist though more likely viral gastroenteritis given symptoms and exposure -Patient advised to return or notify a doctor immediately if symptoms worsen or persist or new concerns arise.  There are no Patient Instructions on file for this visit.   Kriste BasqueKIM, Trentin Knappenberger R.

## 2015-04-08 ENCOUNTER — Encounter: Payer: Self-pay | Admitting: Family Medicine

## 2015-04-08 ENCOUNTER — Ambulatory Visit (INDEPENDENT_AMBULATORY_CARE_PROVIDER_SITE_OTHER): Payer: BC Managed Care – PPO | Admitting: Family Medicine

## 2015-04-08 VITALS — BP 140/84 | HR 88 | Temp 98.2°F | Ht 66.0 in | Wt 258.7 lb

## 2015-04-08 DIAGNOSIS — B9789 Other viral agents as the cause of diseases classified elsewhere: Principal | ICD-10-CM

## 2015-04-08 DIAGNOSIS — J069 Acute upper respiratory infection, unspecified: Secondary | ICD-10-CM | POA: Diagnosis not present

## 2015-04-08 MED ORDER — BENZONATATE 200 MG PO CAPS: 200.0000 mg | ORAL_CAPSULE | Freq: Three times a day (TID) | ORAL | Status: DC | PRN

## 2015-04-08 NOTE — Patient Instructions (Signed)
Viral Infections °A viral infection can be caused by different types of viruses. Most viral infections are not serious and resolve on their own. However, some infections may cause severe symptoms and may lead to further complications. °SYMPTOMS °Viruses can frequently cause: °· Minor sore throat. °· Aches and pains. °· Headaches. °· Runny nose. °· Different types of rashes. °· Watery eyes. °· Tiredness. °· Cough. °· Loss of appetite. °· Gastrointestinal infections, resulting in nausea, vomiting, and diarrhea. °These symptoms do not respond to antibiotics because the infection is not caused by bacteria. However, you might catch a bacterial infection following the viral infection. This is sometimes called a "superinfection." Symptoms of such a bacterial infection may include: °· Worsening sore throat with pus and difficulty swallowing. °· Swollen neck glands. °· Chills and a high or persistent fever. °· Severe headache. °· Tenderness over the sinuses. °· Persistent overall ill feeling (malaise), muscle aches, and tiredness (fatigue). °· Persistent cough. °· Yellow, green, or brown mucus production with coughing. °HOME CARE INSTRUCTIONS  °· Only take over-the-counter or prescription medicines for pain, discomfort, diarrhea, or fever as directed by your caregiver. °· Drink enough water and fluids to keep your urine clear or pale yellow. Sports drinks can provide valuable electrolytes, sugars, and hydration. °· Get plenty of rest and maintain proper nutrition. Soups and broths with crackers or rice are fine. °SEEK IMMEDIATE MEDICAL CARE IF:  °· You have severe headaches, shortness of breath, chest pain, neck pain, or an unusual rash. °· You have uncontrolled vomiting, diarrhea, or you are unable to keep down fluids. °· You or your child has an oral temperature above 102° F (38.9° C), not controlled by medicine. °· Your baby is older than 3 months with a rectal temperature of 102° F (38.9° C) or higher. °· Your baby is 3  months old or younger with a rectal temperature of 100.4° F (38° C) or higher. °MAKE SURE YOU:  °· Understand these instructions. °· Will watch your condition. °· Will get help right away if you are not doing well or get worse. °  °This information is not intended to replace advice given to you by your health care provider. Make sure you discuss any questions you have with your health care provider. °  °Document Released: 03/29/2005 Document Revised: 09/11/2011 Document Reviewed: 11/25/2014 °Elsevier Interactive Patient Education ©2016 Elsevier Inc. ° °

## 2015-04-08 NOTE — Progress Notes (Signed)
Pre visit review using our clinic review tool, if applicable. No additional management support is needed unless otherwise documented below in the visit note. 

## 2015-04-08 NOTE — Progress Notes (Signed)
   Subjective:    Patient ID: Katrina Callahan, female    DOB: 04/29/1976, 39 y.o.   MRN: [id number]  HPI Acute visit. Nonsmoker seen with 2 week history of some sinus congestion and cough. Cough is been mostly dry. She thinks she may have had some low-grade fever initially but none recently. She is taken some Mucinex and Aleve. Denies any body aches. No headaches.  Patient has long-standing history of obesity. Frequently eats out and frequent fast food. She works 2 full-time jobs so has very little time to [employer] and no time to exercise.  Past Medical History  Diagnosis Date  . Asthma   . Genital herpes    Past Surgical History  Procedure Laterality Date  . Ectopic pregnancy surgery  2001  . Foot surgery  2003    both bunion and hammertoe  . Ectopic pregnancy surgery  2001  . Bunionectomy  2003    reports that she has never smoked. She does not have any smokeless tobacco history on file. Her alcohol and drug histories are not on file. family history includes Cancer in her father and mother; Hyperlipidemia in her father, maternal grandfather, maternal grandmother, mother, paternal grandfather, and paternal grandmother; Hypertension in her brother, father, maternal grandfather, maternal grandmother, mother, paternal grandfather, paternal grandmother, and sister. Allergies  Allergen Reactions  . Bee Venom     Trouble breathing  . Erythromycin Shortness Of Breath  . Penicillins     anaphlysis      Review of Systems  Constitutional: Positive for fatigue. Negative for fever and chills.  HENT: Positive for congestion. Negative for sore throat.   Respiratory: Positive for cough.   Neurological: Negative for headaches.       Objective:   Physical Exam  Constitutional: She appears well-developed and well-nourished. No distress.  HENT:  Right Ear: External ear normal.  Left Ear: External ear normal.  Mouth/Throat: Oropharynx is clear and moist.  Neck: Neck supple.    Cardiovascular: Normal rate and regular rhythm.   Pulmonary/Chest: Effort normal and breath sounds normal. No respiratory distress. She has no wheezes. She has no rales.  Lymphadenopathy:    She has no cervical adenopathy.          Assessment & Plan:  Viral URI with cough. Tessalon Perles 200 mg every 8 hours as needed. Continue Mucinex. Follow-up for any fever or worsening symptoms  Regarding her obesity we've recommend that she check with insurance if she has insurance coverage for nutrition consult. Her comorbidities would include borderline elevated blood pressure and history of prediabetes. She is encouraged to schedule complete physical

## 2015-07-19 ENCOUNTER — Encounter: Payer: Self-pay | Admitting: Family Medicine

## 2015-07-19 ENCOUNTER — Ambulatory Visit (INDEPENDENT_AMBULATORY_CARE_PROVIDER_SITE_OTHER): Payer: BC Managed Care – PPO | Admitting: Family Medicine

## 2015-07-19 VITALS — BP 140/100 | HR 89 | Temp 98.7°F | Ht 66.0 in | Wt 259.9 lb

## 2015-07-19 DIAGNOSIS — I10 Essential (primary) hypertension: Secondary | ICD-10-CM

## 2015-07-19 MED ORDER — LOSARTAN POTASSIUM-HCTZ 50-12.5 MG PO TABS: 1.0000 | ORAL_TABLET | Freq: Every day | ORAL | Status: DC

## 2015-07-19 NOTE — Progress Notes (Signed)
   Subjective:    Patient ID: Katrina Callahan, female    DOB: [birth year], 40 y.o.   MRN: [id number]  HPI Here for evaluation of elevated blood pressure. She's had borderline elevation for years. Went to gynecologist recently and was told her blood pressure was "elevated". She is not sure of exact magnitude. She has recently had some nonspecific symptoms of malaise and fatigue. Mild ankle and leg edema. No headaches. No recent nonsteroidal use. Rare alcohol use. Currently not taking any regular medications.  She is not sexually active. No plans to become pregnant. Nonsmoker. No consistent exercise.  Past Medical History  Diagnosis Date  . Asthma   . Genital herpes    Past Surgical History  Procedure Laterality Date  . Ectopic pregnancy surgery  2001  . Foot surgery  2003    both bunion and hammertoe  . Ectopic pregnancy surgery  2001  . Bunionectomy  2003    reports that she has never smoked. She does not have any smokeless tobacco history on file. Her alcohol and drug histories are not on file. family history includes Cancer in her father and mother; Hyperlipidemia in her father, maternal grandfather, maternal grandmother, mother, paternal grandfather, and paternal grandmother; Hypertension in her brother, father, maternal grandfather, maternal grandmother, mother, paternal grandfather, paternal grandmother, and sister. Allergies  Allergen Reactions  . Bee Venom     Trouble breathing  . Erythromycin Shortness Of Breath  . Penicillins     anaphlysis      Review of Systems  Constitutional: Positive for fatigue. Negative for fever and chills.  Eyes: Negative for visual disturbance.  Respiratory: Negative for cough, chest tightness, shortness of breath and wheezing.   Cardiovascular: Negative for chest pain and palpitations.  Endocrine: Negative for polydipsia and polyuria.  Neurological: Negative for dizziness, seizures, syncope, weakness, light-headedness and headaches.        Objective:   Physical Exam  Constitutional: She appears well-developed and well-nourished.  Neck: Neck supple. No thyromegaly present.  Cardiovascular: Normal rate and regular rhythm.   Pulmonary/Chest: Effort normal and breath sounds normal. No respiratory distress. She has no wheezes. She has no rales.  Musculoskeletal:  No pitting edema. She has minimal edema feet and ankles but nonpitting          Assessment & Plan:  Hypertension. Repeat blood pressure left arm seated at rest 158/100. Initiate losartan HCTZ 50/12.5 mg 1 daily. Try to lose some weight. Information on DASH diet given. Reassess blood pressure 3 weeks and obtain screening lab work in including TSH, basic metabolic panel, CBC

## 2015-07-19 NOTE — Patient Instructions (Signed)
Hypertension Hypertension, commonly called high blood pressure, is when the force of blood pumping through your arteries is too strong. Your arteries are the blood vessels that carry blood from your heart throughout your body. A blood pressure reading consists of a higher number over a lower number, such as 110/72. The higher number (systolic) is the pressure inside your arteries when your heart pumps. The lower number (diastolic) is the pressure inside your arteries when your heart relaxes. Ideally you want your blood pressure below 120/80. Hypertension forces your heart to work harder to pump blood. Your arteries may become narrow or stiff. Having untreated or uncontrolled hypertension can cause heart attack, stroke, kidney disease, and other problems. RISK FACTORS Some risk factors for high blood pressure are controllable. Others are not.  Risk factors you cannot control include:   Race. You may be at higher risk if you are African American.  Age. Risk increases with age.  Gender. Men are at higher risk than women before age 45 years. After age 65, women are at higher risk than men. Risk factors you can control include:  Not getting enough exercise or physical activity.  Being overweight.  Getting too much fat, sugar, calories, or salt in your diet.  Drinking too much alcohol. SIGNS AND SYMPTOMS Hypertension does not usually cause signs or symptoms. Extremely high blood pressure (hypertensive crisis) may cause headache, anxiety, shortness of breath, and nosebleed. DIAGNOSIS To check if you have hypertension, your health care provider will measure your blood pressure while you are seated, with your arm held at the level of your heart. It should be measured at least twice using the same arm. Certain conditions can cause a difference in blood pressure between your right and left arms. A blood pressure reading that is higher than normal on one occasion does not mean that you need treatment. If  it is not clear whether you have high blood pressure, you may be asked to return on a different day to have your blood pressure checked again. Or, you may be asked to monitor your blood pressure at home for 1 or more weeks. TREATMENT Treating high blood pressure includes making lifestyle changes and possibly taking medicine. Living a healthy lifestyle can help lower high blood pressure. You may need to change some of your habits. Lifestyle changes may include:  Following the DASH diet. This diet is high in fruits, vegetables, and whole grains. It is low in salt, red meat, and added sugars.  Keep your sodium intake below 2,300 mg per day.  Getting at least 30-45 minutes of aerobic exercise at least 4 times per week.  Losing weight if necessary.  Not smoking.  Limiting alcoholic beverages.  Learning ways to reduce stress. Your health care provider may prescribe medicine if lifestyle changes are not enough to get your blood pressure under control, and if one of the following is true:  You are 18-59 years of age and your systolic blood pressure is above 140.  You are 60 years of age or older, and your systolic blood pressure is above 150.  Your diastolic blood pressure is above 90.  You have diabetes, and your systolic blood pressure is over 140 or your diastolic blood pressure is over 90.  You have kidney disease and your blood pressure is above 140/90.  You have heart disease and your blood pressure is above 140/90. Your personal target blood pressure may vary depending on your medical conditions, your age, and other factors. HOME CARE INSTRUCTIONS    Have your blood pressure rechecked as directed by your health care provider.   Take medicines only as directed by your health care provider. Follow the directions carefully. Blood pressure medicines must be taken as prescribed. The medicine does not work as well when you skip doses. Skipping doses also puts you at risk for  problems.  Do not smoke.   Monitor your blood pressure at home as directed by your health care provider. SEEK MEDICAL CARE IF:   You think you are having a reaction to medicines taken.  You have recurrent headaches or feel dizzy.  You have swelling in your ankles.  You have trouble with your vision. SEEK IMMEDIATE MEDICAL CARE IF:  You develop a severe headache or confusion.  You have unusual weakness, numbness, or feel faint.  You have severe chest or abdominal pain.  You vomit repeatedly.  You have trouble breathing. MAKE SURE YOU:   Understand these instructions.  Will watch your condition.  Will get help right away if you are not doing well or get worse.   This information is not intended to replace advice given to you by your health care provider. Make sure you discuss any questions you have with your health care provider.   Document Released: 06/19/2005 Document Revised: 11/03/2014 Document Reviewed: 04/11/2013 Elsevier Interactive Patient Education 2016 Else[employer] Inc[employer] DASH Eating Plan DASH stands for "Dietary Approaches to Stop Hypertension." The DASH eating plan is a healthy eating plan that has been shown to reduce high blood pressure (hypertension). Additional health benefits may include reducing the risk of type 2 diabetes mellitus, heart disease, and stroke. The DASH eating plan may also help with weight loss. WHAT DO I NEED TO KNOW ABOUT THE DASH EATING PLAN? For the DASH eating plan, you will follow these general guidelines:  Choose foods with a percent daily value for sodium of less than 5% (as listed on the food label).  Use salt-free seasonings or herbs instead of table salt or sea salt.  Check with your health care provider or pharmacist before using salt substitutes.  Eat lower-sodium products, often labeled as "lower sodium" or "no salt added."  Eat fresh foods.  Eat more vegetables, fruits, and low-fat dairy products.  Choose whole grains.  Look for the word "whole" as the first word in the ingredient list.  Choose fish and skinless chicken or turkey more often than red meat. Limit fish, poultry, and meat to 6 oz (170 g) each day.  Limit sweets, desserts, sugars, and sugary drinks.  Choose heart-healthy fats.  Limit cheese to 1 oz (28 g) per day.  Eat more home-cooked food and less restaurant, buffet, and fast food.  Limit fried foods.  Cook foods using methods other than frying.  Limit canned vegetables. If you do use them, rinse them well to decrease the sodium.  When eating at a restaurant, ask that your food be prepared with less salt, or no salt if possible. WHAT FOODS CAN I EAT? Seek help from a dietitian for individual calorie needs. Grains Whole grain or whole wheat bread. Brown rice. Whole grain or whole wheat pasta. Quinoa, bulgur, and whole grain cereals. Low-sodium cereals. Corn or whole wheat flour tortillas. Whole grain cornbread. Whole grain crackers. Low-sodium crackers. Vegetables Fresh or frozen vegetables (raw, steamed, roasted, or grilled). Low-sodium or reduced-sodium tomato and vegetable juices. Low-sodium or reduced-sodium tomato sauce and paste. Low-sodium or reduced-sodium canned vegetables.  Fruits All fresh, canned (in natural juice), or frozen fruits. Meat and Other   Protein Products Ground beef (85% or leaner), grass-fed beef, or beef trimmed of fat. Skinless chicken or turkey. Ground chicken or turkey. Pork trimmed of fat. All fish and seafood. Eggs. Dried beans, peas, or lentils. Unsalted nuts and seeds. Unsalted canned beans. Dairy Low-fat dairy products, such as skim or 1% milk, 2% or reduced-fat cheeses, low-fat ricotta or cottage cheese, or plain low-fat yogurt. Low-sodium or reduced-sodium cheeses. Fats and Oils Tub margarines without trans fats. Light or reduced-fat mayonnaise and salad dressings (reduced sodium). Avocado. Safflower, olive, or canola oils. Natural peanut or almond  butter. Other Unsalted popcorn and pretzels. The items listed above may not be a complete list of recommended foods or beverages. Contact your dietitian for more options. WHAT FOODS ARE NOT RECOMMENDED? Grains White bread. White pasta. White rice. Refined cornbread. Bagels and croissants. Crackers that contain trans fat. Vegetables Creamed or fried vegetables. Vegetables in a cheese sauce. Regular canned vegetables. Regular canned tomato sauce and paste. Regular tomato and vegetable juices. Fruits Dried fruits. Canned fruit in light or heavy syrup. Fruit juice. Meat and Other Protein Products Fatty cuts of meat. Ribs, chicken wings, bacon, sausage, bologna, salami, chitterlings, fatback, hot dogs, bratwurst, and packaged luncheon meats. Salted nuts and seeds. Canned beans with salt. Dairy Whole or 2% milk, cream, half-and-half, and cream cheese. Whole-fat or sweetened yogurt. Full-fat cheeses or blue cheese. Nondairy creamers and whipped toppings. Processed cheese, cheese spreads, or cheese curds. Condiments Onion and garlic salt, seasoned salt, table salt, and sea salt. Canned and packaged gravies. Worcestershire sauce. Tartar sauce. Barbecue sauce. Teriyaki sauce. Soy sauce, including reduced sodium. Steak sauce. Fish sauce. Oyster sauce. Cocktail sauce. Horseradish. Ketchup and mustard. Meat flavorings and tenderizers. Bouillon cubes. Hot sauce. Tabasco sauce. Marinades. Taco seasonings. Relishes. Fats and Oils Butter, stick margarine, lard, shortening, ghee, and bacon fat. Coconut, palm kernel, or palm oils. Regular salad dressings. Other Pickles and olives. Salted popcorn and pretzels. The items listed above may not be a complete list of foods and beverages to avoid. Contact your dietitian for more information. WHERE CAN I FIND MORE INFORMATION? National Heart, Lung, and [employer] Institute: www.nhlbi.nih.gov/health/health-topics/topics/dash/   This information is not intended to replace  advice given to you by your health care provider. Make sure you discuss any questions you have with your health care provider.   Document Released: 06/08/2011 Document Revised: 07/10/2014 Document Reviewed: 04/23/2013 Elsevier Interactive Patient Education 2016 Elsevier Inc.  

## 2015-07-19 NOTE — Progress Notes (Signed)
Pre visit review using our clinic review tool, if applicable. No additional management support is needed unless otherwise documented below in the visit note. 

## 2015-08-09 ENCOUNTER — Ambulatory Visit (INDEPENDENT_AMBULATORY_CARE_PROVIDER_SITE_OTHER): Payer: BC Managed Care – PPO | Admitting: Family Medicine

## 2015-08-09 VITALS — BP 128/100 | HR 88 | Temp 98.3°F | Ht 66.0 in | Wt 256.4 lb

## 2015-08-09 DIAGNOSIS — R030 Elevated blood-pressure reading, without diagnosis of hypertension: Principal | ICD-10-CM

## 2015-08-09 DIAGNOSIS — R208 Other disturbances of skin sensation: Secondary | ICD-10-CM

## 2015-08-09 DIAGNOSIS — IMO0001 Reserved for inherently not codable concepts without codable children: Secondary | ICD-10-CM

## 2015-08-09 DIAGNOSIS — E669 Obesity, unspecified: Secondary | ICD-10-CM | POA: Diagnosis not present

## 2015-08-09 DIAGNOSIS — I10 Essential (primary) hypertension: Secondary | ICD-10-CM | POA: Diagnosis not present

## 2015-08-09 DIAGNOSIS — D649 Anemia, unspecified: Secondary | ICD-10-CM

## 2015-08-09 DIAGNOSIS — R200 Anesthesia of skin: Secondary | ICD-10-CM

## 2015-08-09 LAB — BASIC METABOLIC PANEL
BUN: 14 mg/dL (ref 6–23)
CHLORIDE: 106 meq/L (ref 96–112)
CO2: 24 meq/L (ref 19–32)
CREATININE: 0.91 mg/dL (ref 0.40–1.20)
Calcium: 9.3 mg/dL (ref 8.4–10.5)
GFR: 87.27 mL/min (ref >60.00)
GLUCOSE: 87 mg/dL (ref 70–99)
Potassium: 3.9 mEq/L (ref 3.5–5.1)
SODIUM: 139 meq/L (ref 135–145)

## 2015-08-09 LAB — CBC WITH DIFFERENTIAL/PLATELET
BASOS PCT: 0.4 % (ref 0.0–3.0)
Basophils Absolute: 0 10*3/uL (ref 0.0–0.1)
EOS PCT: 1.9 % (ref 0.0–5.0)
Eosinophils Absolute: 0.2 10*3/uL (ref 0.0–0.7)
HCT: 36.5 % (ref 36.0–46.0)
Hemoglobin: 12 g/dL (ref 12.0–15.0)
LYMPHS ABS: 2.9 10*3/uL (ref 0.7–4.0)
Lymphocytes Relative: 35.7 % (ref 12.0–46.0)
MCHC: 32.8 g/dL (ref 30.0–36.0)
MCV: 78.6 fl (ref 78.0–100.0)
MONO ABS: 0.7 10*3/uL (ref 0.1–1.0)
MONOS PCT: 8.2 % (ref 3.0–12.0)
NEUTROS ABS: 4.4 10*3/uL (ref 1.4–7.7)
NEUTROS PCT: 53.8 % (ref 43.0–77.0)
Platelets: 328 10*3/uL (ref 150.0–400.0)
RBC: 4.65 Mil/uL (ref 3.87–5.11)
RDW: 14.7 % (ref 11.5–15.5)
WBC: 8.2 10*3/uL (ref 4.0–10.5)

## 2015-08-09 LAB — TSH: TSH: 1.21 u[IU]/mL (ref 0.35–4.50)

## 2015-08-09 NOTE — Progress Notes (Signed)
Pre visit review using our clinic review tool, if applicable. No additional management support is needed unless otherwise documented below in the visit note. 

## 2015-08-09 NOTE — Progress Notes (Signed)
   Subjective:    Patient ID: Katrina Callahan, female    DOB: [birth year], 40 y.o.   MRN: [id number]  HPI Patient seen for follow-up elevated blood pressure. She had recent blood pressure 158/100 and we started Hyzaar 50/12.5 mg 1 daily. She is not monitoring her blood pressure. She has recently had a little bit of "numbness "right lower lip but has had no swelling whatsoever. No angioedema symptoms. She felt this may been related oxybutynin and she stopped that a week ago but her symptoms did not change. She is not have any facial weakness.  Patient has history of chronic normocytic anemia with hemoglobin around 11.5. We plan follow-up labs today.  She has already made some lifestyle changes and is reducing carbs and overall calorie intake and walking more and is lost 3 pounds since last visit.  Past Medical History  Diagnosis Date  . Asthma   . Genital herpes    Past Surgical History  Procedure Laterality Date  . Ectopic pregnancy surgery  2001  . Foot surgery  2003    both bunion and hammertoe  . Ectopic pregnancy surgery  2001  . Bunionectomy  2003    reports that she has never smoked. She does not have any smokeless tobacco history on file. Her alcohol and drug histories are not on file. family history includes Cancer in her father and mother; Hyperlipidemia in her father, maternal grandfather, maternal grandmother, mother, paternal grandfather, and paternal grandmother; Hypertension in her brother, father, maternal grandfather, maternal grandmother, mother, paternal grandfather, paternal grandmother, and sister. Allergies  Allergen Reactions  . Bee Venom     Trouble breathing  . Erythromycin Shortness Of Breath  . Penicillins     anaphlysis      Review of Systems  Constitutional: Negative for fatigue.  Eyes: Negative for visual disturbance.  Respiratory: Negative for cough, chest tightness, shortness of breath and wheezing.   Cardiovascular: Negative for chest pain,  palpitations and leg swelling.  Endocrine: Negative for polydipsia and polyuria.  Neurological: Negative for dizziness, seizures, syncope, weakness, light-headedness and headaches.       Objective:   Physical Exam  Constitutional: She is oriented to person, place, and time. She appears well-developed and well-nourished.  HENT:  No visible lip or tongue edema  Cardiovascular: Normal rate and regular rhythm.   Pulmonary/Chest: Effort normal and breath sounds normal. No respiratory distress. She has no wheezes. She has no rales.  Musculoskeletal: She exhibits no edema.  Neurological: She is alert and oriented to person, place, and time. No cranial nerve deficit.          Assessment & Plan:  #1 hypertension. Systolic blood pressure greatly improve her diastolic is still elevated. We discussed options including increasing her losartan HCTZ dose, change to calcium channel blocker such as amlodipine, or lifestyle modification and reassess in one month and she prefers the latter.  #2 right lower lip numbness. We explained that we are not aware of having a side effect from Hyzaar. She is not describing any angioedema symptoms. If symptoms persist will discontinue Hyzaar and switch to amlodipine  #3 obesity. Continue weight loss efforts. Check TSH.  #4 history of chronic normocytic anemia. Repeat CBC

## 2015-09-06 ENCOUNTER — Ambulatory Visit (INDEPENDENT_AMBULATORY_CARE_PROVIDER_SITE_OTHER): Payer: BC Managed Care – PPO | Admitting: Family Medicine

## 2015-09-06 VITALS — BP 132/90 | HR 88 | Temp 98.3°F | Ht 66.0 in | Wt 260.5 lb

## 2015-09-06 DIAGNOSIS — I10 Essential (primary) hypertension: Secondary | ICD-10-CM

## 2015-09-06 MED ORDER — LOSARTAN POTASSIUM-HCTZ 100-25 MG PO TABS: 1.0000 | ORAL_TABLET | Freq: Every day | ORAL | Status: DC

## 2015-09-06 NOTE — Progress Notes (Signed)
Pre visit review using our clinic review tool, if applicable. No additional management support is needed unless otherwise documented below in the visit note. 

## 2015-09-06 NOTE — Patient Instructions (Signed)
Go ahead and increase the Losartan HCTZ to 100/25 mg one daily May double up your current med to equal the above Lose some weight Let's plan follow up in about 3 months.

## 2015-09-06 NOTE — Progress Notes (Signed)
   Subjective:    Patient ID: Katrina Callahan, female    DOB: 1976/03/11, 40 y.o.   MRN: [id number]  HPI  Follow-up hypertension. Currently takes losartan HCTZ 50/12.5 mg. Compliant with therapy. Unfortunately, she has not lost any weight since last visit. Currently works 2 jobs and has very little time to exercise. No headaches or dizziness. No chest pains. No regular alcohol use.  Past Medical History  Diagnosis Date  . Asthma   . Genital herpes    Past Surgical History  Procedure Laterality Date  . Ectopic pregnancy surgery  2001  . Foot surgery  2003    both bunion and hammertoe  . Ectopic pregnancy surgery  2001  . Bunionectomy  2003    reports that she has never smoked. She does not have any smokeless tobacco history on file. Her alcohol and drug histories are not on file. family history includes Cancer in her father and mother; Hyperlipidemia in her father, maternal grandfather, maternal grandmother, mother, paternal grandfather, and paternal grandmother; Hypertension in her brother, father, maternal grandfather, maternal grandmother, mother, paternal grandfather, paternal grandmother, and sister. Allergies  Allergen Reactions  . Bee Venom     Trouble breathing  . Erythromycin Shortness Of Breath  . Penicillins     anaphlysis     Review of Systems  Constitutional: Negative for fatigue.  Eyes: Negative for visual disturbance.  Respiratory: Negative for cough, chest tightness, shortness of breath and wheezing.   Cardiovascular: Negative for chest pain, palpitations and leg swelling.  Neurological: Negative for dizziness, seizures, syncope, weakness, light-headedness and headaches.       Objective:   Physical Exam  Constitutional: She appears well-developed and well-nourished.  Eyes: Pupils are equal, round, and reactive to light.  Neck: Neck supple. No JVD present. No thyromegaly present.  Cardiovascular: Normal rate and regular rhythm.  Exam reveals no  gallop.   Pulmonary/Chest: Effort normal and breath sounds normal. No respiratory distress. She has no wheezes. She has no rales.  Musculoskeletal: She exhibits no edema.  Neurological: She is alert.          Assessment & Plan:  Hypertension. Suboptimal control. Continue lifestyle modification. We have highly advise weight loss and establishing more consistent exercise. Increase losartan HCTZ to 100/25 mg 1 daily. Reassess blood pressure 3 months.  She is encouraged to check and see whether her insurance will cover things like Weight [employer] area

## 2015-12-29 ENCOUNTER — Ambulatory Visit (INDEPENDENT_AMBULATORY_CARE_PROVIDER_SITE_OTHER): Payer: BC Managed Care – PPO | Admitting: Family Medicine

## 2015-12-29 VITALS — BP 138/90 | HR 72 | Temp 98.3°F | Ht 66.0 in | Wt 259.1 lb

## 2015-12-29 DIAGNOSIS — I10 Essential (primary) hypertension: Secondary | ICD-10-CM | POA: Diagnosis not present

## 2015-12-29 DIAGNOSIS — J069 Acute upper respiratory infection, unspecified: Secondary | ICD-10-CM

## 2015-12-29 DIAGNOSIS — E669 Obesity, unspecified: Secondary | ICD-10-CM | POA: Diagnosis not present

## 2015-12-29 MED ORDER — ALBUTEROL SULFATE HFA 108 (90 BASE) MCG/ACT IN AERS: 2.0000 | INHALATION_SPRAY | RESPIRATORY_TRACT | Status: DC | PRN

## 2015-12-29 MED ORDER — DOXYCYCLINE HYCLATE 100 MG PO CAPS: 100.0000 mg | ORAL_CAPSULE | Freq: Two times a day (BID) | ORAL | Status: DC

## 2015-12-29 NOTE — Addendum Note (Signed)
Addended by: Tempie HoistMCNEIL, AUTUMN M on: 12/29/2015 09:24 AM   Modules accepted: Orders

## 2015-12-29 NOTE — Progress Notes (Signed)
   Subjective:    Patient ID: Katrina Callahan, female    DOB: 1976/03/16, 40 y.o.   MRN: [id number]  HPI Patient here for the following issues  Follow-up hypertension and obesity. She's basically not lost any weight since last visit. Job is very sedentary. No regular exercise. We increased her losartan HCTZ to 100/25 mg last visit. Mostly compliant and rarely misses doses. Denies any headaches. No chest pains. No edema. Not monitoring blood pressures regularly.  She complains of a painful "boil "right buttock region. First noted about 5 days ago. Increased malaise but no fevers or chills. No prior history of MRSA. Doing warm compresses and warm soaks without much improvement.  Past Medical History  Diagnosis Date  . Asthma   . Genital herpes    Past Surgical History  Procedure Laterality Date  . Ectopic pregnancy surgery  2001  . Foot surgery  2003    both bunion and hammertoe  . Ectopic pregnancy surgery  2001  . Bunionectomy  2003    reports that she has never smoked. She does not have any smokeless tobacco history on file. Her alcohol and drug histories are not on file. family history includes Cancer in her father and mother; Hyperlipidemia in her father, maternal grandfather, maternal grandmother, mother, paternal grandfather, and paternal grandmother; Hypertension in her brother, father, maternal grandfather, maternal grandmother, mother, paternal grandfather, paternal grandmother, and sister. Allergies  Allergen Reactions  . Bee Venom     Trouble breathing  . Erythromycin Shortness Of Breath  . Penicillins     anaphlysis      Review of Systems  Constitutional: Positive for fatigue. Negative for fever and chills.  Eyes: Negative for visual disturbance.  Respiratory: Negative for cough, chest tightness, shortness of breath and wheezing.   Cardiovascular: Negative for chest pain, palpitations and leg swelling.  Neurological: Negative for dizziness, seizures,  syncope, weakness, light-headedness and headaches.       Objective:   Physical Exam  Constitutional: She appears well-developed and well-nourished.  Eyes: Pupils are equal, round, and reactive to light.  Neck: Neck supple. No JVD present. No thyromegaly present.  Cardiovascular: Normal rate and regular rhythm.  Exam reveals no gallop.   Pulmonary/Chest: Effort normal and breath sounds normal. No respiratory distress. She has no wheezes. She has no rales.  Musculoskeletal: She exhibits no edema.  Neurological: She is alert.  Skin:  Right upper thigh to the junction with the buttock she has area of induration about 1 cm diameter. There is very minimal fluctuance in the center and she has some spontaneous draining pus from this region. Minimally tender. Minimal cellulitis changes.          Assessment & Plan:  #1 hypertension. Marginal control. We strongly denies weight loss. She agrees with setting up nutrition referral. She had several questions regarding weight loss medications and we explained our reluctance to use medications like phentermine because of the fact these are only short-term use anyway and also with her blood pressure not being well controlled would not recommend stimulant medication. Reassess in 3 months  #2 Abscess right buttock region-this is draining spontaneously. There is very minimal fluctuance and mostly induration. We recommended sitz baths couple times daily. Start doxycycline 100 mg twice daily for 10 days. Reassess 2 days and consider I&D in if not further improved  Kristian CoveyBruce W Jordynn Marcella MD  [facility]

## 2015-12-29 NOTE — Patient Instructions (Signed)
Recommend warm sitz baths or warm compresses to right buttock area 2-3 times daily Start antibiotic and take with a full glass of water Follow-up in 2 days to reassess and sooner as needed

## 2015-12-29 NOTE — Progress Notes (Signed)
Pre visit review using our clinic review tool, if applicable. No additional management support is needed unless otherwise documented below in the visit note. 

## 2015-12-31 ENCOUNTER — Ambulatory Visit (INDEPENDENT_AMBULATORY_CARE_PROVIDER_SITE_OTHER): Payer: BC Managed Care – PPO | Admitting: Family Medicine

## 2015-12-31 VITALS — HR 101 | Temp 98.1°F | Ht 66.0 in | Wt 256.9 lb

## 2015-12-31 DIAGNOSIS — L02415 Cutaneous abscess of right lower limb: Secondary | ICD-10-CM

## 2015-12-31 NOTE — Progress Notes (Signed)
Pre visit review using our clinic review tool, if applicable. No additional management support is needed unless otherwise documented below in the visit note. 

## 2015-12-31 NOTE — Progress Notes (Signed)
   Subjective:    Patient ID: Katrina Callahan, female    DOB: 1976-02-21, 40 y.o.   MRN: [id number]  HPI Follow-up abscess right thigh. She was seen 2 days ago and started on doxycycline and warm compresses. She had some mild spontaneous drainage and this is mostly indurated so we elected not to perform incision and drainage 2 days ago. Overall, this feels much better. Less painful. No fevers or chills. No visible drainage.  Past Medical History  Diagnosis Date  . Asthma   . Genital herpes    Past Surgical History  Procedure Laterality Date  . Ectopic pregnancy surgery  2001  . Foot surgery  2003    both bunion and hammertoe  . Ectopic pregnancy surgery  2001  . Bunionectomy  2003    reports that she has never smoked. She does not have any smokeless tobacco history on file. Her alcohol and drug histories are not on file. family history includes Cancer in her father and mother; Hyperlipidemia in her father, maternal grandfather, maternal grandmother, mother, paternal grandfather, and paternal grandmother; Hypertension in her brother, father, maternal grandfather, maternal grandmother, mother, paternal grandfather, paternal grandmother, and sister. Allergies  Allergen Reactions  . Bee Venom     Trouble breathing  . Erythromycin Shortness Of Breath  . Penicillins     anaphlysis      Review of Systems  Constitutional: Negative for fever and chills.       Objective:   Physical Exam  Constitutional: She appears well-developed and well-nourished.  Cardiovascular: Normal rate and regular rhythm.   Skin:  Right upper inner thigh region reveals area now of fluctuance about 1 cm. Slightly swollen. Less erythematous. Minimally tender. She has diminished surrounding cellulitis changes c/w 2 days ago.          Assessment & Plan:  Abscess right thigh. We recommended incision and drainage given the fact this is more fluctuant today. Patient consents. Skin prepped with  Betadine. Anesthesia 1% plain Xylocaine. Using #11 blade, small linear incision made over area of fluctuance. Some additional purulence was expressed along with contents and fragments of sebaceous cyst. Minimal bleeding. Wound cavity packed with quarter-inch iodoform gauze and outer dressing applied. She'll return on Monday for reassessment Keep dressing relatively dry over the weekend.  Kristian CoveyBruce W Adalida Garver MD [facility]

## 2015-12-31 NOTE — Patient Instructions (Signed)
Leave packing in until follow up on Monday. If packing comes out before then, go ahead with warm compresses several times daily and keep area clean with soap and water.

## 2016-01-03 ENCOUNTER — Encounter: Payer: Self-pay | Admitting: Family Medicine

## 2016-01-03 ENCOUNTER — Ambulatory Visit (INDEPENDENT_AMBULATORY_CARE_PROVIDER_SITE_OTHER): Payer: BC Managed Care – PPO | Admitting: Family Medicine

## 2016-01-03 VITALS — BP 140/100 | HR 84 | Temp 98.3°F | Resp 16 | Ht 66.0 in | Wt 260.0 lb

## 2016-01-03 DIAGNOSIS — I10 Essential (primary) hypertension: Secondary | ICD-10-CM

## 2016-01-03 DIAGNOSIS — L02415 Cutaneous abscess of right lower limb: Secondary | ICD-10-CM

## 2016-01-03 NOTE — Patient Instructions (Signed)
Keep would clean with soap and water Follow up for any signs of recurrent infection such as pain, redness, warmth Let's plan on 3 month follow up of hypertension Try to lose some weight between now and then.

## 2016-01-03 NOTE — Progress Notes (Signed)
   Subjective:    Patient ID: Katrina Callahan, female    DOB: 1976-05-06, 40 y.o.   MRN: [id number]  HPI Follow-up abscess of right thigh. Refer to recent note. Incision and drainage and she had evidence for sebaceous cyst We remove some fragments of the cyst at the time of incision and drainage She thinks the packing came out Saturday. She has no pain. Mild itching. Minimal drainage. No fever or chills.  Hypertension treated with losartan HCTZ. Compliant with therapy. No headaches. No dizziness. She is had poor control and we have recommended weight loss and increased aerobic exercise and reassess in 3 month follow-up.  Past Medical History  Diagnosis Date  . Asthma   . Genital herpes    Past Surgical History  Procedure Laterality Date  . Ectopic pregnancy surgery  2001  . Foot surgery  2003    both bunion and hammertoe  . Ectopic pregnancy surgery  2001  . Bunionectomy  2003    reports that she has never smoked. She does not have any smokeless tobacco history on file. Her alcohol and drug histories are not on file. family history includes Cancer in her father and mother; Hyperlipidemia in her father, maternal grandfather, maternal grandmother, mother, paternal grandfather, and paternal grandmother; Hypertension in her brother, father, maternal grandfather, maternal grandmother, mother, paternal grandfather, paternal grandmother, and sister. Allergies  Allergen Reactions  . Bee Venom     Trouble breathing  . Erythromycin Shortness Of Breath  . Penicillins     anaphlysis  . Acyclovir And Related       Review of Systems  Constitutional: Negative for fever and chills.       Objective:   Physical Exam  Constitutional: She appears well-developed and well-nourished.  Cardiovascular: Normal rate and regular rhythm.   Skin:  Wound is examined. No surrounding cellulitis. No purulent drainage. Small remnant of cyst sac was removed with forceps. No bleeding            Assessment & Plan:  Abscess right thigh. This turned out to be a abscessed sebaceous cyst. Incision and drainage as above. Clinically improved. Leave out packing at this time. Keep clean with soap and water. Follow-up for any concerns  Hypertension. Suboptimal control. Refer to recent note from 12/29/15. We would like to give this 3 more months of lifestyle management and weight loss and then reassess. If not further improved at time consider addition of low-dose amlodipine  Kristian CoveyBruce W Tate Zagal MD [facility]

## 2016-01-17 ENCOUNTER — Other Ambulatory Visit: Payer: Self-pay | Admitting: Obstetrics and Gynecology

## 2016-01-17 DIAGNOSIS — Z1231 Encounter for screening mammogram for malignant neoplasm of breast: Secondary | ICD-10-CM

## 2016-01-27 ENCOUNTER — Encounter: Payer: BC Managed Care – PPO | Attending: Family Medicine | Admitting: Dietician

## 2016-01-27 DIAGNOSIS — Z713 Dietary counseling and surveillance: Secondary | ICD-10-CM | POA: Diagnosis not present

## 2016-01-27 DIAGNOSIS — I10 Essential (primary) hypertension: Secondary | ICD-10-CM | POA: Insufficient documentation

## 2016-01-27 DIAGNOSIS — E669 Obesity, unspecified: Secondary | ICD-10-CM | POA: Insufficient documentation

## 2016-01-27 NOTE — Progress Notes (Signed)
Medical Nutrition Therapy:  Appt start time: 1545 end time:  1700.   Assessment:  Primary concerns today: Patient is here alone.  She would like to lose weight.  Referral is for HTN.  She states that MD will increase her HTN meds if she cannot get it down.    Weight hx:   Lowest adult weight 140 lbs 10 years ago Highest weight 260 lbs Today's weight 256 lbs  She hates Weight Watchers in the past but hated it.  She tried [place] weight loss and like this the best.  (Weighed weekly and used a meal plate as well as someone to talk to about this and was knitting as well during this time.) She lost 50 lbs at that time. Then regained it then lost weight again in 2012/11/03 after the death of her mother in 2012-11-03.  She was her mother's caregiver.  She has poor body image.  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  She is finishing grad school and has one more semester.  She has a Mountain bike but has not had time to use it.  She lives alone.  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  She sleeps 5:30-9:30 and most all day Saturday.  She lives alone.  She enjoys knitting and sewing.  Preferred Learning Style:   No preference indicated   Learning Readiness:   Ready  MEDICATIONS: see list   DIETARY INTAKE:  Usual eating pattern includes 2 meals and 0 snacks per day.  Everyday foods include Mrs. Dash.  Avoided foods include milk, added salt, rare processed meat, artificial sweeter.  24-hr recall:  B (8 AM): [place] ([place] sausage biscuit combo with sweet tea) OR Lucky Charms with Silk Almond milk Snk ( AM): none  L ( PM): skips Snk ( PM): none D ( PM):  grilled Salmon with small amount of canola oil, Broccoli with I can't believe it's not butter Snk ( PM): none Beverages: sweet tea, stopped drinking soda, water  Usual physical activity: none  Estimated energy needs: 1600  calories 120 g protein   Progress Towards Goal(s):  In progress.   Nutritional Diagnosis:  NB-1.1 Food and nutrition-related knowledge deficit As related to balance of nutrition for HTN and weight managment.  As evidenced by patient report.    Intervention:  Nutrition counseling/education related to mindful eating.  Discussed benefits of decreasing sodium and increasing fruit and vegetable intake. Discussed importance of balance in life and problems solved for any changes that could be made.    Find something to do for pleasure each week. Consider resuming knitting or sewing. Choose your food mindfully, eat slowly, stop when you are satisfied. Avoid skipping meals. Breakfast, lunch, dinner daily. Increase your intake of vegetables and fruit. Buy precut vegetables and fruit or frozen vegetables for convenience. Consider alternatives to eating out in the morning or alternative choices when eating out. Continue to decrease the amount of sugar in your tea.  Teaching Method Utilized:  Visual Auditory Hands on  Handouts given during visit include:  DASH diet for HTN  Snack list  My plate  Affordable exercise options in [place].  Barriers to learning/adherence to lifestyle change: she works 2 jobs, gets inadequate sleep, and has little social support.  Demonstrated degree of understanding via:  Teach Back   Monitoring/Evaluation:  Dietary intake, exercise, and  body weight prn.

## 2016-01-27 NOTE — Patient Instructions (Addendum)
Find something to do for pleasure each week. Consider resuming knitting or sewing. Choose your food mindfully, eat slowly, stop when you are satisfied. Avoid skipping meals. Breakfast, lunch, dinner daily. Increase your intake of vegetables and fruit. Buy precut vegetables and fruit or frozen vegetables for convenience. Consider alternatives to eating out in the morning or alternative choices when eating out. Continue to decrease the amount of sugar in your tea.

## 2016-06-19 ENCOUNTER — Other Ambulatory Visit: Payer: Self-pay | Admitting: Family Medicine

## 2016-09-23 ENCOUNTER — Other Ambulatory Visit: Payer: Self-pay | Admitting: Family Medicine

## 2016-12-27 ENCOUNTER — Other Ambulatory Visit: Payer: Self-pay | Admitting: Family Medicine

## 2017-03-28 ENCOUNTER — Other Ambulatory Visit: Payer: Self-pay | Admitting: Family Medicine

## 2017-06-01 ENCOUNTER — Encounter: Payer: Self-pay | Admitting: Family Medicine

## 2017-06-01 ENCOUNTER — Ambulatory Visit: Payer: BC Managed Care – PPO | Admitting: Family Medicine

## 2017-06-01 ENCOUNTER — Other Ambulatory Visit: Payer: Self-pay | Admitting: Family Medicine

## 2017-06-01 VITALS — BP 150/100 | HR 85 | Temp 98.4°F | Wt 266.6 lb

## 2017-06-01 DIAGNOSIS — R635 Abnormal weight gain: Secondary | ICD-10-CM | POA: Diagnosis not present

## 2017-06-01 DIAGNOSIS — I10 Essential (primary) hypertension: Secondary | ICD-10-CM | POA: Diagnosis not present

## 2017-06-01 MED ORDER — AMLODIPINE BESYLATE 5 MG PO TABS: 5.0000 mg | ORAL_TABLET | Freq: Every day | ORAL | 5 refills | Status: DC

## 2017-06-01 NOTE — Progress Notes (Signed)
Subjective:     Patient ID: Katrina Callahan, female   DOB: Jan 24, 1976, 41 y.o.   MRN: [id number]  HPI Patient here for follow-up hypertension. She takes losartan HCTZ but states she's been somewhat inconsistent with use. She went a few weeks ago to have dental procedure and was told her blood pressure was "too high " to have procedure done. 2 days ago she's joined [employer] and has not yet started. Does not get any regular exercise. Had gradual weight gain in recent years. No headaches. No dizziness. No chest pains. No peripheral edema. Tries to avoid sodium. No regular alcohol use. No regular nonsteroidal use  Past Medical History:  Diagnosis Date  . Asthma   . Genital herpes    Past Surgical History:  Procedure Laterality Date  . BUNIONECTOMY  2003  . ECTOPIC PREGNANCY SURGERY  2001  . ECTOPIC PREGNANCY SURGERY  2001  . FOOT SURGERY  2003   both bunion and hammertoe    reports that  has never smoked. she has never used smokeless tobacco. Her alcohol and drug histories are not on file. family history includes Cancer in her father and mother; Hyperlipidemia in her father, maternal grandfather, maternal grandmother, mother, paternal grandfather, and paternal grandmother; Hypertension in her brother, father, maternal grandfather, maternal grandmother, mother, paternal grandfather, paternal grandmother, and sister. Allergies  Allergen Reactions  . Bee Venom     Trouble breathing  . Erythromycin Shortness Of Breath  . Penicillins     anaphlysis  . Acyclovir And Related      Review of Systems  Constitutional: Negative for fatigue and unexpected weight change.  Eyes: Negative for visual disturbance.  Respiratory: Negative for cough, chest tightness, shortness of breath and wheezing.   Cardiovascular: Negative for chest pain, palpitations and leg swelling.  Endocrine: Negative for cold intolerance, polydipsia and polyuria.  Neurological: Negative for dizziness, seizures,  syncope, weakness, light-headedness and headaches.       Objective:   Physical Exam  Constitutional: She appears well-developed and well-nourished.  Eyes: Pupils are equal, round, and reactive to light.  Neck: Neck supple. No JVD present. No thyromegaly present.  Cardiovascular: Normal rate and regular rhythm. Exam reveals no gallop.  Pulmonary/Chest: Effort normal and breath sounds normal. No respiratory distress. She has no wheezes. She has no rales.  Musculoskeletal: She exhibits no edema.  Neurological: She is alert.       Assessment:     #1 hypertension poorly controlled with history of poor compliance  #2 weight gain    Plan:     -Add amlodipine 5 mg daily and make sure she is taking her losartan HCTZ regularly -Discussed importance of regular aerobic exercise managing blood pressure -Keep sodium intake less 2500 mg daily -She is strongly encouraed to lose some weight. We discussed strategies -Reassess in one month. Consider follow-up labs then  Kristian CoveyBruce W Rayyan Burley MD [place]

## 2017-06-01 NOTE — Patient Instructions (Signed)
Start the Amlodipine, as discussed Don't skip doses of either BP medication Try to lose some weight Try to keep sodium < 2,500 mg per day. Try to get into some regular exercise.

## 2017-07-02 ENCOUNTER — Ambulatory Visit: Payer: BC Managed Care – PPO | Admitting: Family Medicine

## 2017-07-02 ENCOUNTER — Encounter: Payer: Self-pay | Admitting: *Deleted

## 2017-07-02 ENCOUNTER — Encounter: Payer: Self-pay | Admitting: Family Medicine

## 2017-07-02 VITALS — BP 122/88 | HR 81 | Temp 98.3°F | Ht 65.5 in | Wt 270.0 lb

## 2017-07-02 DIAGNOSIS — I10 Essential (primary) hypertension: Secondary | ICD-10-CM

## 2017-07-02 NOTE — Progress Notes (Signed)
Subjective:     Patient ID: Katrina Callahan, female   DOB: 10/31/1975, 41 y.o.   MRN: [id number]  HPI Patient here for follow-up hypertension. She has started Weight Watchers and also has ordered a fitbit and is working with a trainer now 4 days per week. She has just started her exercise program. Her weight is not down any from last visit. Her blood pressure then was 150/100 and we added amlodipine. She'll he took this for about a week but had some headaches and stopped this. She has apparently been inconsistent with taking losartan but is now taking that daily. She is actually on losartan HCTZ 50/12.5 mg 1 daily. No headaches. No edema. She's trying to watch her sodium intake closely.  Past Medical History:  Diagnosis Date  . Asthma   . Genital herpes    Past Surgical History:  Procedure Laterality Date  . BUNIONECTOMY  2003  . ECTOPIC PREGNANCY SURGERY  2001  . ECTOPIC PREGNANCY SURGERY  2001  . FOOT SURGERY  2003   both bunion and hammertoe    reports that  has never smoked. she has never used smokeless tobacco. Her alcohol and drug histories are not on file. family history includes Cancer in her father and mother; Hyperlipidemia in her father, maternal grandfather, maternal grandmother, mother, paternal grandfather, and paternal grandmother; Hypertension in her brother, father, maternal grandfather, maternal grandmother, mother, paternal grandfather, paternal grandmother, and sister. Allergies  Allergen Reactions  . Bee Venom     Trouble breathing  . Erythromycin Shortness Of Breath  . Penicillins     anaphlysis  . Acyclovir And Related      Review of Systems  Constitutional: Negative for fatigue.  Eyes: Negative for visual disturbance.  Respiratory: Negative for cough, chest tightness, shortness of breath and wheezing.   Cardiovascular: Negative for chest pain, palpitations and leg swelling.  Endocrine: Negative for polydipsia and polyuria.  Neurological: Negative for  dizziness, seizures, syncope, weakness, light-headedness and headaches.       Objective:   Physical Exam  Constitutional: She appears well-developed and well-nourished.  Eyes: Pupils are equal, round, and reactive to light.  Neck: Neck supple. No JVD present. No thyromegaly present.  Cardiovascular: Normal rate and regular rhythm. Exam reveals no gallop.  Pulmonary/Chest: Effort normal and breath sounds normal. No respiratory distress. She has no wheezes. She has no rales.  Musculoskeletal: She exhibits no edema.  Neurological: She is alert.       Assessment:     Hypertension improved. Possible side effect with amlodipine as above    Plan:     -Continue losartan HCTZ -Continue weight loss efforts with exercise and Weight Watchers -Follow-up in 3 months and plan fasting labs then including basic chemistries and A1c  Katrina CoveyBruce W Elinora Weigand MD [facility]

## 2017-07-02 NOTE — Patient Instructions (Signed)
Continue with weight loss Stay on the Losartan HCTZ.

## 2017-09-19 ENCOUNTER — Ambulatory Visit: Payer: Self-pay | Admitting: *Deleted

## 2017-09-19 NOTE — Telephone Encounter (Signed)
Pt has  History  Of  Asthma  In past   Has  Has  Off  And  On  Symptoms  Of  Mild   Shortness  Of  Breath  And   Chest  Discomfort x  5  Days Associated  With   Taking  Breath that is  intermittent   And    Brief  In nature.It  Was  releived  Several  Days  Ago  By OTC  meds   Pt  Is  Speaking in  Complete  sentences  And  At this  Time  Is  In no  Acute  Distress.  Pt  Also  Reports  Symptoms  Of  Gas  As   Well . Pt  Reports  A history  Of  Asthma  But  Has  Not  Had  An  Episode  Recently. Appointment  Made  With Dr  Caryl Never in 2  Days. Reason for Disposition . [1] MILD longstanding difficulty breathing AND [2]  SAME as normal  Answer Assessment - Initial Assessment Questions 1. RESPIRATORY STATUS: "Describe your breathing?" (e.g., wheezing, shortness of breath, unable to speak, severe coughing)    Some  Tightness  In  Chest  When inhaled   Denies  Any  Wheezing    Slight  Coughing this  Am    2. ONSET: "When did this breathing problem begin?"      5  Days  ago 3. PATTERN "Does the difficult breathing come and go, or has it been constant since it started?"       Comes   And  Goes     4. SEVERITY: "How bad is your breathing?" (e.g., mild, moderate, severe)    - MILD: No SOB at rest, mild SOB with walking, speaks normally in sentences, can lay down, no retractions, pulse < 100.    - MODERATE: SOB at rest, SOB with minimal exertion and prefers to sit, cannot lie down flat, speaks in phrases, mild retractions, audible wheezing, pulse 100-120.    - SEVERE: Very SOB at rest, speaks in single words, struggling to breathe, sitting hunched forward, retractions, pulse > 120        Mild    5. RECURRENT SYMPTOM: "Have you had difficulty breathing before?" If so, ask: "When was the last time?" and "What happened that time?"       Long  Time  ago 6. CARDIAC HISTORY: "Do you have any history of heart disease?" (e.g., heart attack, angina, bypass surgery, angioplasty)        No   7. LUNG HISTORY: "Do you  have any history of lung disease?"  (e.g., pulmonary embolus, asthma, emphysema)      Asthma  In  Past   8. 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 OTHER SYMPTOMS: "Do you have any other symptoms? (e.g., dizziness, runny nose, cough, chest pain, fever)     Chest  discomfort   And  belching  And  flatulence  10. PREGNANCY: "Is there any chance you are pregnant?" "When was your last menstrual period?"       Ended  2  Days  Ago  11. TRAVEL: "Have you traveled out of the country in the last month?" (e.g., travel history, exposures)  No  Protocols used: BREATHING DIFFICULTY-A-AH

## 2017-09-20 ENCOUNTER — Other Ambulatory Visit: Payer: Self-pay

## 2017-09-20 ENCOUNTER — Emergency Department (HOSPITAL_COMMUNITY)
Admission: EM | Admit: 2017-09-20 | Discharge: 2017-09-20 | Disposition: A | Discharge status: Home / Self Care | Payer: BC Managed Care – PPO | Attending: Emergency Medicine | Admitting: Emergency Medicine

## 2017-09-20 ENCOUNTER — Ambulatory Visit: Payer: BC Managed Care – PPO | Admitting: Adult Health

## 2017-09-20 ENCOUNTER — Telehealth: Payer: Self-pay | Admitting: Family Medicine

## 2017-09-20 ENCOUNTER — Emergency Department (HOSPITAL_COMMUNITY): Payer: BC Managed Care – PPO

## 2017-09-20 DIAGNOSIS — R079 Chest pain, unspecified: Secondary | ICD-10-CM | POA: Insufficient documentation

## 2017-09-20 DIAGNOSIS — M549 Dorsalgia, unspecified: Secondary | ICD-10-CM | POA: Insufficient documentation

## 2017-09-20 DIAGNOSIS — I10 Essential (primary) hypertension: Secondary | ICD-10-CM | POA: Insufficient documentation

## 2017-09-20 DIAGNOSIS — R0600 Dyspnea, unspecified: Secondary | ICD-10-CM | POA: Diagnosis present

## 2017-09-20 DIAGNOSIS — J181 Lobar pneumonia, unspecified organism: Secondary | ICD-10-CM | POA: Diagnosis not present

## 2017-09-20 DIAGNOSIS — R202 Paresthesia of skin: Secondary | ICD-10-CM | POA: Diagnosis not present

## 2017-09-20 DIAGNOSIS — R600 Localized edema: Secondary | ICD-10-CM | POA: Insufficient documentation

## 2017-09-20 DIAGNOSIS — Z79899 Other long term (current) drug therapy: Secondary | ICD-10-CM | POA: Diagnosis not present

## 2017-09-20 DIAGNOSIS — J189 Pneumonia, unspecified organism: Secondary | ICD-10-CM

## 2017-09-20 DIAGNOSIS — J45909 Unspecified asthma, uncomplicated: Secondary | ICD-10-CM | POA: Diagnosis not present

## 2017-09-20 DIAGNOSIS — R0601 Orthopnea: Secondary | ICD-10-CM | POA: Insufficient documentation

## 2017-09-20 LAB — CBC
HEMATOCRIT: 34.5 % — AB (ref 36.0–46.0)
Hemoglobin: 11.5 g/dL — ABNORMAL LOW (ref 12.0–15.0)
MCH: 25.9 pg — ABNORMAL LOW (ref 26.0–34.0)
MCHC: 33.3 g/dL (ref 30.0–36.0)
MCV: 77.7 fL — AB (ref 78.0–100.0)
Platelets: 351 10*3/uL (ref 150–400)
RBC: 4.44 MIL/uL (ref 3.87–5.11)
RDW: 14.1 % (ref 11.5–15.5)
WBC: 12.3 10*3/uL — AB (ref 4.0–10.5)

## 2017-09-20 LAB — BASIC METABOLIC PANEL
ANION GAP: 10 (ref 5–15)
BUN: 8 mg/dL (ref 6–20)
CHLORIDE: 105 mmol/L (ref 101–111)
CO2: 22 mmol/L (ref 22–32)
Calcium: 9.1 mg/dL (ref 8.9–10.3)
Creatinine, Ser: 0.94 mg/dL (ref 0.44–1.00)
Glucose, Bld: 100 mg/dL — ABNORMAL HIGH (ref 65–99)
POTASSIUM: 3.9 mmol/L (ref 3.5–5.1)
SODIUM: 137 mmol/L (ref 135–145)

## 2017-09-20 LAB — I-STAT TROPONIN, ED
TROPONIN I, POC: 0 ng/mL (ref 0.00–0.08)
Troponin i, poc: 0.07 ng/mL (ref 0.00–0.08)

## 2017-09-20 LAB — D-DIMER, QUANTITATIVE: D-Dimer, Quant: 0.69 ug/mL-FEU — ABNORMAL HIGH (ref 0.00–0.50)

## 2017-09-20 LAB — I-STAT BETA HCG BLOOD, ED (MC, WL, AP ONLY): HCG, QUANTITATIVE: 5.1 m[IU]/mL — AB (ref <5)

## 2017-09-20 LAB — BRAIN NATRIURETIC PEPTIDE: B Natriuretic Peptide: 48.3 pg/mL (ref 0.0–100.0)

## 2017-09-20 MED ORDER — DOXYCYCLINE HYCLATE 100 MG PO TABS
100.0000 mg | ORAL_TABLET | Freq: Once | ORAL | Status: AC
  Administered 2017-09-20: 100 mg via ORAL
  Filled 2017-09-20: qty 1

## 2017-09-20 MED ORDER — DOXYCYCLINE HYCLATE 100 MG PO CAPS: 100.0000 mg | ORAL_CAPSULE | Freq: Two times a day (BID) | ORAL | 0 refills | Status: DC

## 2017-09-20 MED ORDER — ACETAMINOPHEN 325 MG PO TABS
650.0000 mg | ORAL_TABLET | Freq: Once | ORAL | Status: AC
  Administered 2017-09-20: 650 mg via ORAL
  Filled 2017-09-20: qty 2

## 2017-09-20 MED ORDER — IOPAMIDOL (ISOVUE-370) INJECTION 76%
100.0000 mL | Freq: Once | INTRAVENOUS | Status: AC | PRN
  Administered 2017-09-20: 100 mL via INTRAVENOUS

## 2017-09-20 MED ORDER — HYDROCODONE-GUAIFENESIN 2.5-200 MG/5ML PO SOLN: 10.0000 mL | ORAL | 0 refills | Status: DC | PRN

## 2017-09-20 NOTE — Discharge Instructions (Addendum)
Your CT scan today was concerning for a developing pneumonia in the bases of both lungs, but particularly the right lung.  Doxycycline is an antibiotic.  Take one tablet of doxycycline in the morning and one tablet at night for the next 7 days. Your first dose was given in the Emergency Department.  Take 10 mL of cough syrup every 4-6 hours as needed for cough.  Please do not work or drive while taking this medication because it contains hydrocodone, which is a narcotic and can cause you to be impaired.  It is important that you rest and continue to hydrate and drink plenty of fluids.  If you develop new or worsening symptoms including difficulty breathing, worsening symptoms after taking the antibiotics for 48-72 hours, chest pain with activity that doesn't resolve, or other new concerning symptoms, please return to the emergency department for re-evaluation.

## 2017-09-20 NOTE — Telephone Encounter (Signed)
Copied from CRM [phone]. Topic: Quick Communication - See Telephone Encounter >> Sep 20, 2017  9:04 AM Rudi CocoLathan, Steffanie Mingle M, NT wrote: CRM for notification. See Telephone encounter for: 09/20/17.  Tried calling pt. Twice mailbox is full. Called to let pt. Know that her appt. Is at 3:30pm today not 1pm the schedule jumped to Friday instead of today for same day appt. Pcp isn't in office today. If pt. Does call back please let her know. Cb number for pt. [phone]

## 2017-09-20 NOTE — ED Provider Notes (Signed)
[facility] EMERGENCY DEPARTMENT Provider Note   CSN: [id number] Arrival date & time: 09/20/17  1144     History   Chief Complaint Chief Complaint  Patient presents with  . Chest Pain    HPI Katrina Callahan is a 42 y.o. female with a h/o of asthma, obesity, and HTN who presents to the emergency department with a chief complaint of dyspnea with associated chest pain, back pain, orthopnea, lower extremity swelling, and right arm paresthesias.  The patient reports waxing and waning dyspnea over the last week, worse with exertion and bending over and alleviated by nothing with associated aching, constant central chest pain, that radiates up towards that neck and to the right chest that suddenly worsened this Katrina. She states "I feel like I'm taking shallow breaths because I can't take a deep breath." She reports that she had to stop and take a break walking down the 4 flights of stairs from the parking deck to come into the hospital.   Along with her worsening dyspnea and chest pain, she developed "tingling" paresthesias, radiating down the right arm, right-sided thoracic back pain that began this Katrina, and an intermittent, non-productive cough last night.   Also has +orthopnea- she usually sleeps with 1 pillow, which has increased to 3 over the last few days.   She denies palpitations, HA, weakness, numbness, lightheadedness, nausea, vomiting, diarrhea, dysuria, fever, or chills.   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 No new exercises or injuries. She treated her symptoms with Cold and Flu medication without improvement. 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   Family history is positive for DVT (mother), CVD (maternal family), DM (maternal family), and (HTN (maternal family). She reports that several family members had MI and CVD medical problems in their late 62's and  early 2's.   The history is provided by the patient. No language interpreter was used.   Past Medical History:  Diagnosis Date  . Asthma   . Genital herpes     Patient Active Problem List   Diagnosis Date Noted  . Essential hypertension 07/19/2015  . Elevated blood pressure 11/05/2012  . Prediabetes 11/05/2012  . Herpes simplex type 2 infection 01/05/2012  . Obesity 01/05/2012  . Bee sting allergy 01/05/2012    Past Surgical History:  Procedure Laterality Date  . BUNIONECTOMY  2003  . ECTOPIC PREGNANCY SURGERY  2001  . ECTOPIC PREGNANCY SURGERY  2001  . FOOT SURGERY  2003   both bunion and hammertoe    OB History   None      Home Medications    Prior to Admission medications   Medication Sig Start Date End Date Taking? Authorizing Provider  albuterol (PROVENTIL HFA;VENTOLIN HFA) 108 (90 Base) MCG/ACT inhaler Inhale 2 puffs into the lungs every 4 (four) hours as needed for wheezing. 12/29/15   Burchette, Elberta Fortis, MD  doxycycline (VIBRAMYCIN) 100 MG capsule Take 1 capsule (100 mg total) by mouth 2 (two) times daily. 09/20/17   Miria Cappelli A, PA-C  EPINEPHrine (EPIPEN) 0.3 mg/0.3 mL DEVI Inject 0.3 mLs (0.3 mg total) into the muscle once. 01/05/12   Burchette, Elberta Fortis, MD  HYDROcodone-GuaiFENesin 2.5-200 MG/5ML SOLN Take 10 mLs by mouth every 4 (four) hours as needed (cough). 09/20/17   Jamisha Hoeschen A, PA-C  losartan-hydrochlorothiazide (HYZAAR) 50-12.5 MG tablet TAKE 1 TABLET BY MOUTH EVERY DAY  06/04/17   Burchette, Elberta Fortis, MD  oxybutynin (DITROPAN-XL) 10 MG 24 hr tablet Take 10 mg by mouth daily. 06/23/15   [provider]    Family History Family History  Problem Relation Age of Onset  . Hyperlipidemia Mother   . Hypertension Mother   . Cancer Mother        stomach  . Cancer Father        prostate  . Hyperlipidemia Father   . Hypertension Father   . Hypertension Sister   . Hypertension Brother   . Hypertension Maternal Grandmother   .  Hyperlipidemia Maternal Grandmother   . Hypertension Maternal Grandfather   . Hyperlipidemia Maternal Grandfather   . Hypertension Paternal Grandmother   . Hyperlipidemia Paternal Grandmother   . Hypertension Paternal Grandfather   . Hyperlipidemia Paternal Grandfather     Social History Social History   Tobacco Use  . Smoking status: Never Smoker  . Smokeless tobacco: Never Used  Substance Use Topics  . Alcohol use: Not on file  . Drug use: Not on file     Allergies   Bee venom; Erythromycin; Penicillins; and Acyclovir and related   Review of Systems Review of Systems  Constitutional: Negative for activity change, chills and fever.  HENT: Negative for congestion and sore throat.   Eyes: Negative for visual disturbance.  Respiratory: Positive for cough and shortness of breath. Negative for wheezing.        Orthopnea  Cardiovascular: Positive for leg swelling. Negative for chest pain and palpitations.  Gastrointestinal: Negative for abdominal pain, diarrhea, nausea and vomiting.  Genitourinary: Negative for dysuria.  Musculoskeletal: Positive for back pain. Negative for gait problem, neck pain and neck stiffness.  Skin: Negative for rash.  Allergic/Immunologic: Negative for immunocompromised state.  Neurological: Negative for dizziness, weakness, light-headedness, numbness and headaches.       Paresthesias  Psychiatric/Behavioral: Negative for confusion.     Physical Exam Updated Vital Signs BP (!) 144/86   Pulse 87   Temp 98.7 F (37.1 C) (Oral)   Resp 18   LMP 09/17/2017 (Within Days)   SpO2 99%   Physical Exam  Constitutional: She is oriented to person, place, and time. She appears well-developed and well-nourished. No distress.  Obese female  HENT:  Head: Normocephalic and atraumatic.  Right Ear: Tympanic membrane and external ear normal.  Left Ear: Tympanic membrane and external ear normal.  Mouth/Throat: Uvula is midline, oropharynx is clear and moist  and mucous membranes are normal.  Eyes: Conjunctivae are normal.  Neck: Neck supple.  Cardiovascular: Normal rate, regular rhythm, normal heart sounds and intact distal pulses. Exam reveals no gallop and no friction rub.  No murmur heard. Pulmonary/Chest: Effort normal and breath sounds normal. No stridor. No respiratory distress. She has no wheezes. She has no rales. She exhibits no tenderness.  Abdominal: Soft. She exhibits no distension and no mass. There is no tenderness. There is no rebound and no guarding. No hernia.  Musculoskeletal: She exhibits no edema.       Right lower leg: She exhibits no tenderness and no edema.       Left lower leg: She exhibits no tenderness and no edema.  Neurological: She is alert and oriented to person, place, and time.  Cranial nerves 2-12 grossly intact. Finger-to-nose is normal. 5/5 motor strength of the bilateral upper and lower extremities. Moves all four extremities. Negative Romberg. Symmetric tandem gait. NVI.    Skin: Skin is warm and dry. Capillary refill  takes less than 2 seconds. No rash noted. She is not diaphoretic. No erythema. No pallor.  Psychiatric: She has a normal mood and affect.  Nursing note and vitals reviewed.  ED Treatments / Results  Labs (all labs ordered are listed, but only abnormal results are displayed) Labs Reviewed  BASIC METABOLIC PANEL - Abnormal; Notable for the following components:      Result Value   Glucose, Bld 100 (*)    All other components within normal limits  CBC - Abnormal; Notable for the following components:   WBC 12.3 (*)    Hemoglobin 11.5 (*)    HCT 34.5 (*)    MCV 77.7 (*)    MCH 25.9 (*)    All other components within normal limits  D-DIMER, QUANTITATIVE (NOT AT [facility]) - Abnormal; Notable for the following components:   D-Dimer, Quant 0.69 (*)    All other components within normal limits  I-STAT BETA HCG BLOOD, ED ([facility], [facility], [facility] ONLY) - Abnormal; Notable for the following components:   I-stat  hCG, quantitative 5.1 (*)    All other components within normal limits  BRAIN NATRIURETIC PEPTIDE  I-STAT TROPONIN, ED  I-STAT TROPONIN, ED    EKG  EKG Interpretation Rate is normal. Sinus rhythm. T wave inversion in V1.        Radiology Dg Chest 2 View  Result Date: 09/20/2017 CLINICAL DATA:  Pt states central chest pain that started last week 5 days ago, originally dismissing sx because she was getting over a cold. she states today she began having pain down her right arm> Pt scheduled to see her PCP but states pain has gotten worse so she came to the ED. Pt states pain is worse with deep breathing. First time occurenceHx: Asthma, non smoker, htn EXAM: CHEST - 2 VIEW COMPARISON:  None. FINDINGS: Cardiac silhouette is normal in size and configuration. Normal mediastinal and hilar contours. There is streaky opacity in both lower lobes consistent with atelectasis. Remainder of the lungs is clear. No pleural effusion or pneumothorax. Skeletal structures are unremarkable. IMPRESSION: 1. No acute cardiopulmonary disease. 2. Atelectasis at both lung bases. Electronically Signed   By: Amie Portlandavid  Ormond M.D.   On: 09/20/2017 13:04   Ct Angio Chest Pe W And/or Wo Contrast  Result Date: 09/20/2017 CLINICAL DATA:  Chest pain for 1 week.  Elevated D-dimer. EXAM: CT ANGIOGRAPHY CHEST WITH CONTRAST TECHNIQUE: Multidetector CT imaging of the chest was performed using the standard protocol during bolus administration of intravenous contrast. Multiplanar CT image reconstructions and MIPs were obtained to evaluate the vascular anatomy. CONTRAST:  100mL ISOVUE-370 IOPAMIDOL (ISOVUE-370) INJECTION 76% COMPARISON:  Radiographs earlier this day. FINDINGS: Cardiovascular: There are no filling defects within the central pulmonary arteries to the segmental level to suggest pulmonary embolus. Subsegmental branches cannot be assessed due to soft tissue attenuation from body habitus and contrast bolus timing. Normal caliber  thoracic aorta without dissection. Heart size upper normal. Trace pericardial effusion. Mediastinum/Nodes: No enlarged mediastinal hilar lymph nodes. Visualized thyroid gland is normal. The esophagus is decompressed. Visualized thyroid gland is normal. Lungs/Pleura: Bilateral dependent lower lobe opacities, right greater than left. Trace right pleural effusion. Trachea and mainstem bronchi are patent. Lower lobe bronchial thickening. No pulmonary edema. Calcified granuloma in the right lower lobe. No pulmonary mass. Upper Abdomen: No acute finding.  Probable hepatic steatosis. Musculoskeletal: There are no acute or suspicious osseous abnormalities. Review of the MIP images confirms the above findings. IMPRESSION: 1. No central pulmonary embolus  to the segmental level. 2. Bilateral dependent lower lobe opacities favoring atelectasis. Pneumonia is also considered particularly in the right lower lobe with trace adjacent pleural effusion. Electronically Signed   By: Rubye Oaks M.D.   On: 09/20/2017 21:13    Procedures Procedures (including critical care time)  Medications Ordered in ED Medications  acetaminophen (TYLENOL) tablet 650 mg (650 mg Oral Given 09/20/17 2102)  iopamidol (ISOVUE-370) 76 % injection 100 mL (100 mLs Intravenous Contrast Given 09/20/17 2042)  doxycycline (VIBRA-TABS) tablet 100 mg (100 mg Oral Given 09/20/17 2148)     Initial Impression / Assessment and Plan / ED Course  I have reviewed the triage vital signs and the nursing notes.  Pertinent labs & imaging results that were available during my care of the patient were reviewed by me and considered in my medical decision making (see chart for details).     42 year old female with a history of asthma, obesity, and HTN resenting with dyspnea, chest pain, back pain, orthopnea, and lower extremity swelling.  CXR with atelectasis in the bilateral bases. EKG with NSR. Mild leukocytosis of 12.3.  CMP and BNP are unremarkable.   D-dimer is elevated; CT chest angio concerning for pneumonia in the bilateral bases, particularly in the right base versus atelectasis.  Delta troponin is negative.  Doubt ACS, PE, tamponade, influenza, myocarditis, or pericarditis.  Given leukocytosis with a right basilar pleural effusion, will treat the patient for community-acquired pneumonia with doxycycline.  Cough suppressant given. A 71-month prescription history query was performed using the [place] CSRS prior to discharge.  The patient was ambulatory in the ED without hypoxia or difficulty.  Strict return precautions given.  No acute distress.  She has good outpatient follow-up.  She is safe for discharge home at this time.   Final Clinical Impressions(s) / ED Diagnoses   Final diagnoses:  Community acquired pneumonia of right lower lobe of lung (HCC)  Community acquired pneumonia of left lower lobe of lung [facility])    ED Discharge Orders        Ordered    doxycycline (VIBRAMYCIN) 100 MG capsule  2 times daily     09/20/17 2139    HYDROcodone-GuaiFENesin 2.5-200 MG/5ML SOLN  Every 4 hours PRN     09/20/17 2139       Frederik Pear A, PA-C 09/21/17 0127    Charlynne Pander, MD 09/21/17 [phone]

## 2017-09-20 NOTE — ED Notes (Signed)
Patient ambulatory to bathroom with steady gait at this time 

## 2017-09-20 NOTE — ED Notes (Signed)
Pt back in room from CT 

## 2017-09-20 NOTE — ED Notes (Signed)
Patient transported to CT 

## 2017-09-20 NOTE — ED Triage Notes (Signed)
Pt states central chest pain that started last week, she states today she began having pain down her right arm> Pt scheduled to see her PCP but states pain has gotten worse so she came to the ED. Pt states pain is worse with deep breathing. Skin warm and dry, pt tearful in triage.

## 2017-09-21 ENCOUNTER — Other Ambulatory Visit: Payer: Self-pay

## 2017-09-21 ENCOUNTER — Ambulatory Visit: Payer: BC Managed Care – PPO | Admitting: Family Medicine

## 2017-09-21 ENCOUNTER — Ambulatory Visit: Payer: BC Managed Care – PPO | Admitting: Adult Health

## 2017-10-03 ENCOUNTER — Ambulatory Visit: Payer: BC Managed Care – PPO | Admitting: Family Medicine

## 2017-10-03 ENCOUNTER — Encounter: Payer: Self-pay | Admitting: Family Medicine

## 2017-10-03 VITALS — BP 120/90 | HR 108 | Temp 98.9°F | Wt 260.4 lb

## 2017-10-03 DIAGNOSIS — I10 Essential (primary) hypertension: Secondary | ICD-10-CM

## 2017-10-03 DIAGNOSIS — J189 Pneumonia, unspecified organism: Secondary | ICD-10-CM | POA: Diagnosis not present

## 2017-10-03 NOTE — Patient Instructions (Signed)
Try some Aleve or Advil (with food) and may take up to two times per day.  Let us know if not feeling better in 1-2 weeks.

## 2017-10-03 NOTE — Progress Notes (Signed)
Subjective:     Patient ID: Katrina Callahan, female   DOB: 1975/10/23, 42 y.o.   MRN: [id number]  HPI Patient seen for hospital follow-up. She has history of hypertension treated with losartan HCTZ. She presented to ER on March 21 with some dyspnea, chest pain, mild lower extremity swelling and right arm paresthesias. Denied any fever or chills. She had apparently had some nonproductive cough. Positive family history of DVT in grandmother. ER notes stated mother but patient states this was actually her grandmother.   She was afebrile in the ER with pulse oximetry of 99%. Labs significant for white blood count 12.3 thousand. Her d-dimer was slightly elevated 0.69. Troponins were normal and BNP level normal. Chest x-ray showed streaky opacity both lower lobes consistent with atelectasis. CT angiography revealed no pulmonary embolus. Bilateral dependent lower lobe opacities favoring atelectasis. Pneumonia was also consider right lower lobe.  She was placed on doxycycline 100 mg twice daily and is about to finish that up. She still has some generalized weakness but does feel somewhat better overall at this time. She denies any fevers or chills. She does complain of some nonspecific chest wall soreness which is worse with movement. She's not taking any nonsteroidals. She's not aware of any wheezing. No hemoptysis.  Past Medical History:  Diagnosis Date  . Asthma   . Genital herpes    Past Surgical History:  Procedure Laterality Date  . BUNIONECTOMY  2003  . ECTOPIC PREGNANCY SURGERY  2001  . ECTOPIC PREGNANCY SURGERY  2001  . FOOT SURGERY  2003   both bunion and hammertoe    reports that she has never smoked. She has never used smokeless tobacco. Her alcohol and drug histories are not on file. family history includes Cancer in her father and mother; Hyperlipidemia in her father, maternal grandfather, maternal grandmother, mother, paternal grandfather, and paternal grandmother; Hypertension in her  brother, father, maternal grandfather, maternal grandmother, mother, paternal grandfather, paternal grandmother, and sister. Allergies  Allergen Reactions  . Bee Venom     Trouble breathing  . Erythromycin Shortness Of Breath  . Penicillins     anaphlysis  . Acyclovir And Related      Review of Systems  Constitutional: Negative for chills and fever.  Respiratory: Negative for cough and wheezing.   Cardiovascular: Negative for palpitations and leg swelling.  Gastrointestinal: Negative for abdominal pain, nausea and vomiting.  Neurological: Negative for dizziness.       Objective:   Physical Exam  Constitutional: She appears well-developed and well-nourished.  Neck: Neck supple.  Cardiovascular: Normal rate and regular rhythm.  Pulmonary/Chest: Effort normal and breath sounds normal. No respiratory distress. She has no wheezes. She has no rales.  Musculoskeletal: She exhibits no edema.  Lymphadenopathy:    She has no cervical adenopathy.       Assessment:     Recent possible community acquired pneumonia right lower lobe vs atelectasis. She did have evidence for small effusion and mildly elevated white count. Improved following doxycycline    Plan:     -Finish out doxycycline -Follow-up immediately for any recurrent fever, shortness of breath, or other concerns -We recommend she try short-term trial of ibuprofen or Aleve for her chest wall pain  Kristian CoveyBruce W Tamy Accardo MD [facility]

## 2018-02-15 ENCOUNTER — Ambulatory Visit (INDEPENDENT_AMBULATORY_CARE_PROVIDER_SITE_OTHER): Payer: BC Managed Care – PPO | Admitting: Family Medicine

## 2018-02-15 ENCOUNTER — Encounter: Payer: Self-pay | Admitting: Family Medicine

## 2018-02-15 VITALS — BP 148/98 | HR 69 | Temp 98.3°F | Wt 271.1 lb

## 2018-02-15 DIAGNOSIS — I10 Essential (primary) hypertension: Secondary | ICD-10-CM

## 2018-02-15 DIAGNOSIS — J069 Acute upper respiratory infection, unspecified: Secondary | ICD-10-CM | POA: Diagnosis not present

## 2018-02-15 MED ORDER — ALBUTEROL SULFATE HFA 108 (90 BASE) MCG/ACT IN AERS: 2.0000 | INHALATION_SPRAY | RESPIRATORY_TRACT | 5 refills | Status: DC | PRN

## 2018-02-15 MED ORDER — LOSARTAN POTASSIUM-HCTZ 50-12.5 MG PO TABS: 1.0000 | ORAL_TABLET | Freq: Every day | ORAL | 1 refills | Status: DC

## 2018-02-15 NOTE — Patient Instructions (Signed)
Continue with weight loss efforts  Get back on daily BP medication.  Let's plan on 3 month follow up.

## 2018-02-15 NOTE — Progress Notes (Signed)
  Subjective:     Patient ID: Katrina Callahan, female   DOB: February 21, 1976, 42 y.o.   MRN: [id number]  HPI Patient seen for follow-up hypertension. She recently ran out of her blood pressure medication and has been off this for several days. She is starting a nutritional supplement program which combines some high-energy, high-protein shakes once a day. She also plans to start regular exercise program. Denies any headaches. No peripheral edema.  Past Medical History:  Diagnosis Date  . Asthma   . Genital herpes    Past Surgical History:  Procedure Laterality Date  . BUNIONECTOMY  2003  . ECTOPIC PREGNANCY SURGERY  2001  . ECTOPIC PREGNANCY SURGERY  2001  . FOOT SURGERY  2003   both bunion and hammertoe    reports that she has never smoked. She has never used smokeless tobacco. Her alcohol and drug histories are not on file. family history includes Cancer in her father and mother; Hyperlipidemia in her father, maternal grandfather, maternal grandmother, mother, paternal grandfather, and paternal grandmother; Hypertension in her brother, father, maternal grandfather, maternal grandmother, mother, paternal grandfather, paternal grandmother, and sister. Allergies  Allergen Reactions  . Bee Venom     Trouble breathing  . Erythromycin Shortness Of Breath  . Penicillins     anaphlysis  . Acyclovir And Related      Review of Systems  Constitutional: Negative for fatigue.  Eyes: Negative for visual disturbance.  Respiratory: Negative for cough, chest tightness, shortness of breath and wheezing.   Cardiovascular: Negative for chest pain, palpitations and leg swelling.  Neurological: Negative for dizziness, seizures, syncope, weakness, light-headedness and headaches.       Objective:   Physical Exam  Constitutional: She appears well-developed and well-nourished.  Eyes: Pupils are equal, round, and reactive to light.  Neck: Neck supple. No JVD present. No thyromegaly present.   Cardiovascular: Normal rate and regular rhythm. Exam reveals no gallop.  Pulmonary/Chest: Effort normal and breath sounds normal. No respiratory distress. She has no wheezes. She has no rales.  Musculoskeletal: She exhibits no edema.  Neurological: She is alert.       Assessment:     Hypertension. Suboptimal control with repeat reading left arm seated after rest 148/98-but patient ran out of medication several days ago    Plan:     -Refill losartan HCTZ -Patient is encouraged to lose some weight and establish more consistent exercise as above. -Reassess blood pressure in 3 months and if not further to go at that point consider additional medication  Kristian CoveyBruce W Raymund Manrique MD [facility]

## 2018-04-16 ENCOUNTER — Telehealth: Payer: Self-pay | Admitting: Family Medicine

## 2018-04-16 NOTE — Telephone Encounter (Signed)
LMVM for the patient to reschedule her appointment with the PCP on 04/29/18  The PCP will be out of the office

## 2018-04-19 ENCOUNTER — Ambulatory Visit: Payer: BC Managed Care – PPO | Admitting: Family Medicine

## 2018-04-19 DIAGNOSIS — Z0289 Encounter for other administrative examinations: Secondary | ICD-10-CM

## 2018-07-26 ENCOUNTER — Encounter: Payer: Self-pay | Admitting: Family Medicine

## 2018-07-26 ENCOUNTER — Ambulatory Visit: Payer: BC Managed Care – PPO | Admitting: Family Medicine

## 2018-07-26 ENCOUNTER — Other Ambulatory Visit: Payer: Self-pay

## 2018-07-26 VITALS — BP 136/82 | HR 82 | Temp 98.4°F | Ht 66.0 in | Wt 266.0 lb

## 2018-07-26 DIAGNOSIS — J069 Acute upper respiratory infection, unspecified: Secondary | ICD-10-CM | POA: Diagnosis not present

## 2018-07-26 DIAGNOSIS — I10 Essential (primary) hypertension: Secondary | ICD-10-CM | POA: Diagnosis not present

## 2018-07-26 MED ORDER — LOSARTAN POTASSIUM-HCTZ 50-12.5 MG PO TABS: 1.0000 | ORAL_TABLET | Freq: Every day | ORAL | 3 refills | Status: DC

## 2018-07-26 MED ORDER — ALBUTEROL SULFATE HFA 108 (90 BASE) MCG/ACT IN AERS: 2.0000 | INHALATION_SPRAY | RESPIRATORY_TRACT | 3 refills | Status: AC | PRN

## 2018-07-26 NOTE — Progress Notes (Signed)
  Subjective:     Patient ID: Katrina Callahan, female   DOB: 1975-12-16, 43 y.o.   MRN: [id number]  HPI  Patient seen for follow-up hypertension.  She takes Hyzaar.  Compliant with therapy.  She recently started gym membership and is trying to get on path of more regular exercise.  She knows she needs to lose some weight.  She has had no recent headaches.  No chest pains.  Past Medical History:  Diagnosis Date  . Asthma   . Genital herpes    Past Surgical History:  Procedure Laterality Date  . BUNIONECTOMY  2003  . ECTOPIC PREGNANCY SURGERY  2001  . ECTOPIC PREGNANCY SURGERY  2001  . FOOT SURGERY  2003   both bunion and hammertoe    reports that she has never smoked. She has never used smokeless tobacco. No history on file for alcohol and drug. family history includes Cancer in her father and mother; Hyperlipidemia in her father, maternal grandfather, maternal grandmother, mother, paternal grandfather, and paternal grandmother; Hypertension in her brother, father, maternal grandfather, maternal grandmother, mother, paternal grandfather, paternal grandmother, and sister. Allergies  Allergen Reactions  . Bee Venom     Trouble breathing  . Erythromycin Shortness Of Breath  . Penicillins     anaphlysis  . Acyclovir And Related     Review of Systems  Constitutional: Negative for fatigue.  Eyes: Negative for visual disturbance.  Respiratory: Negative for cough, chest tightness, shortness of breath and wheezing.   Cardiovascular: Negative for chest pain, palpitations and leg swelling.  Neurological: Negative for dizziness, seizures, syncope, weakness, light-headedness and headaches.       Objective:   Physical Exam Constitutional:      Appearance: She is well-developed.  Eyes:     Pupils: Pupils are equal, round, and reactive to light.  Neck:     Musculoskeletal: Neck supple.     Thyroid: No thyromegaly.     Vascular: No JVD.  Cardiovascular:     Rate and Rhythm: Normal  rate and regular rhythm.     Heart sounds: No gallop.   Pulmonary:     Effort: Pulmonary effort is normal. No respiratory distress.     Breath sounds: Normal breath sounds. No wheezing or rales.  Neurological:     Mental Status: She is alert.        Assessment:     Hypertension.  Stable by today's reading    Plan:     -We recommended that she try to lose some weight -Refilled Hyzaar for 1 year. -Recommend 35-month follow-up and will check basic chemistries then  Kristian Covey MD [facility] at [facility]

## 2018-08-13 IMAGING — DX DG CHEST 2V
2 series · 2 of 2 positions shown · non-contrast
Comparison: None.

CLINICAL DATA: Pt states central chest pain that started last week
5 days ago, originally dismissing sx because she was getting over a
cold. she states today she began having pain down her right arm> Pt
scheduled to see her PCP but states pain has gotten worse so she
came to the ED. Pt states pain is worse with deep breathing. First
time occurenceHx: Asthma, non smoker, htn

EXAM:
CHEST - 2 VIEW

[chest pa]
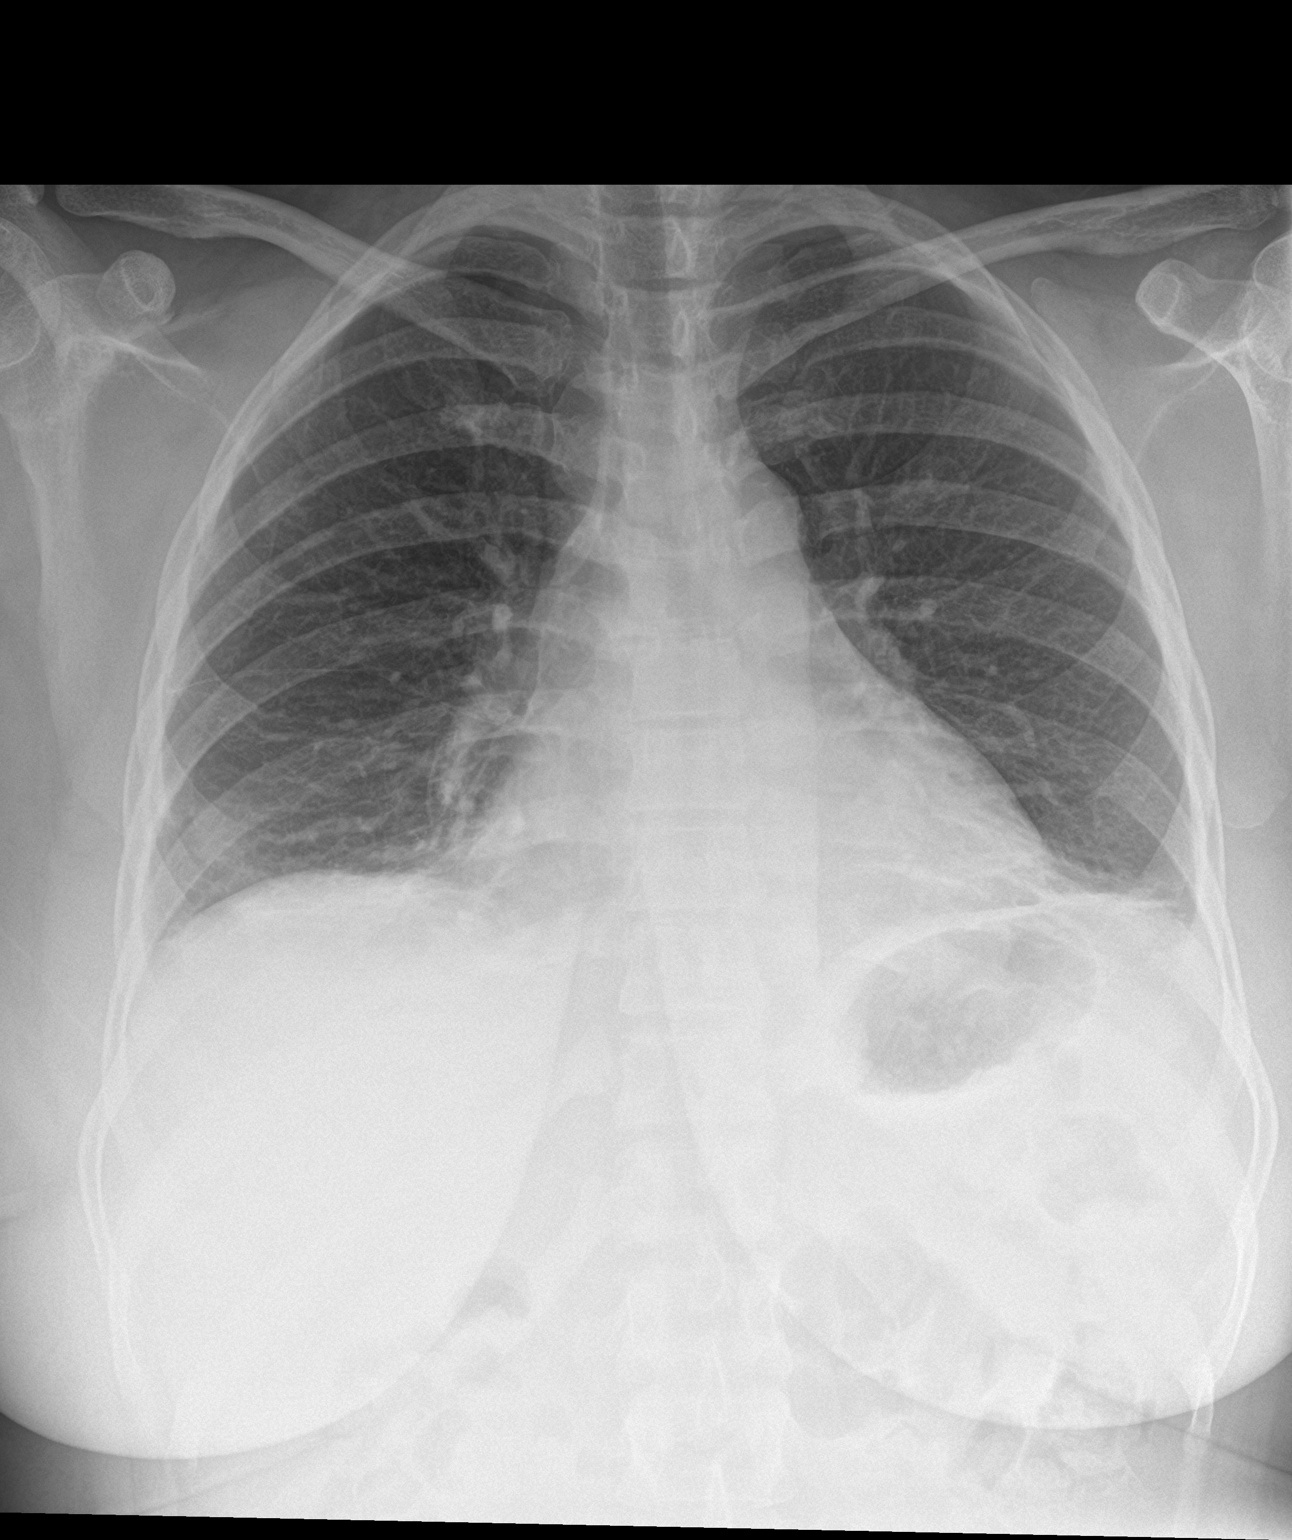

[chest lat]
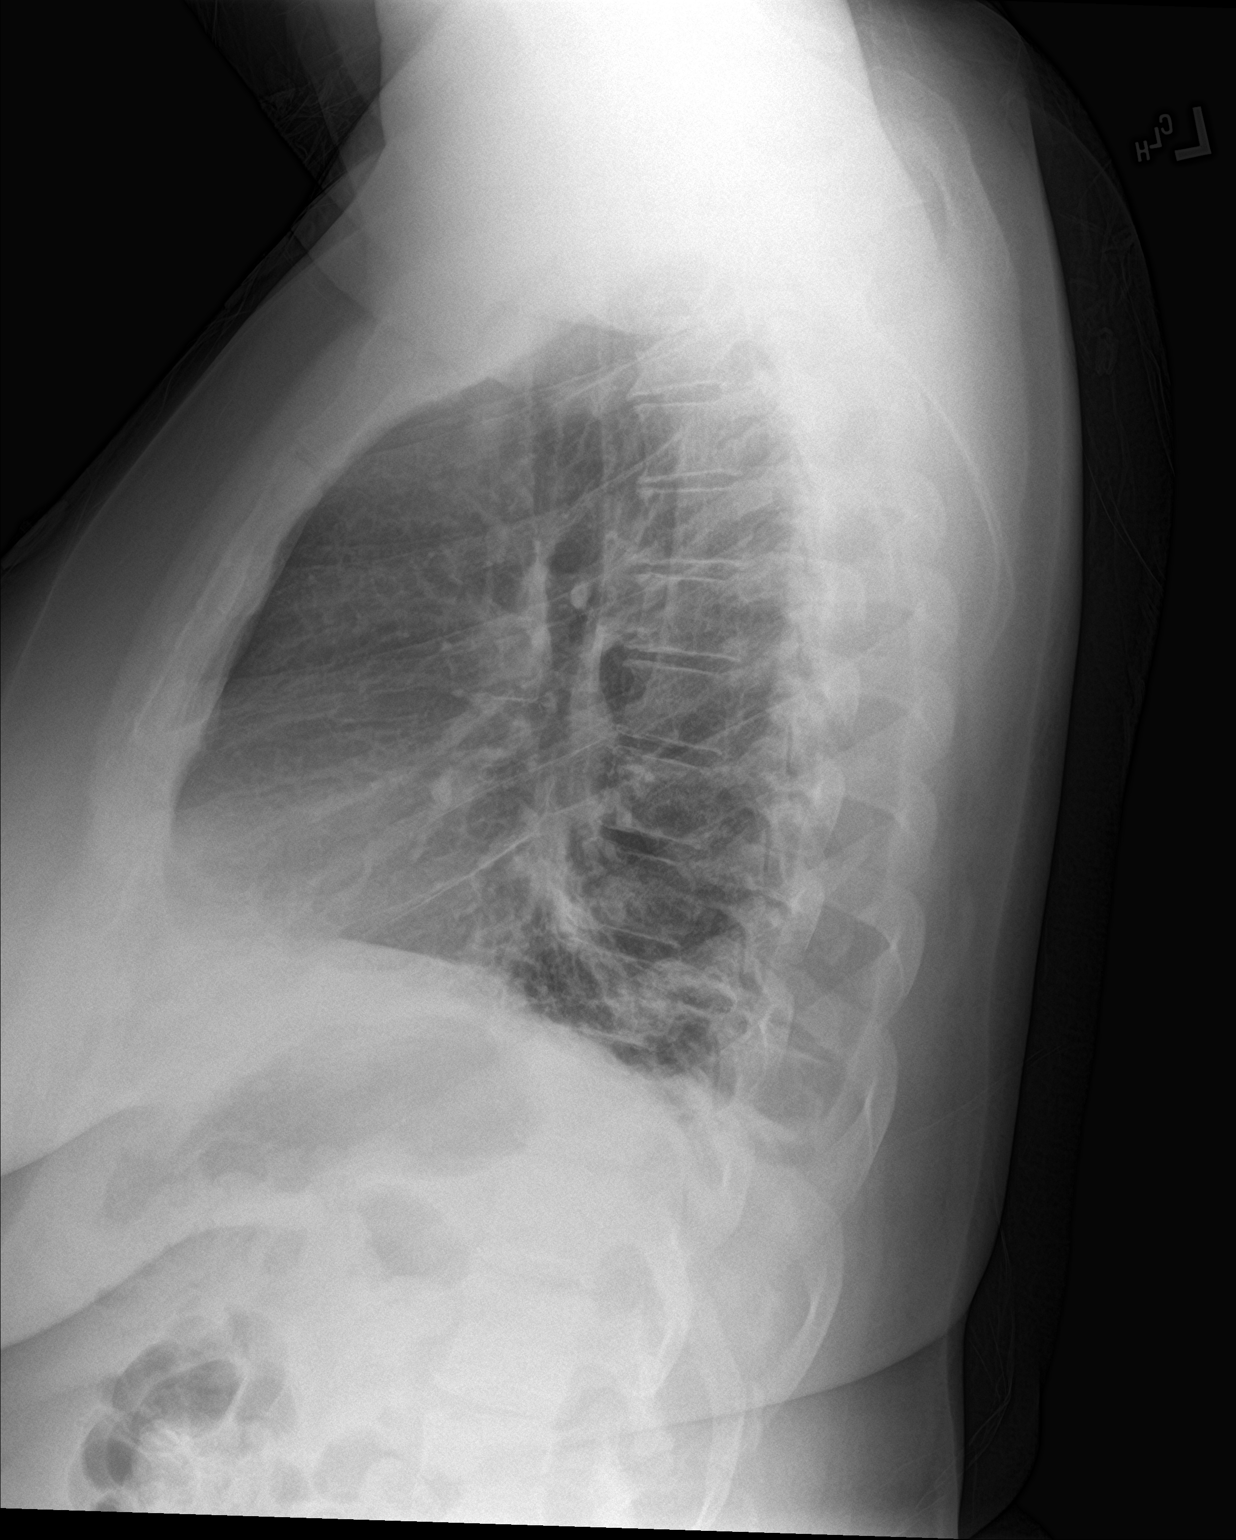

[2 of 2 positions shown; findings below may reference images not displayed]

FINDINGS: Cardiac silhouette is normal in size and configuration. Normal
mediastinal and hilar contours.

There is streaky opacity in both lower lobes consistent with
atelectasis. Remainder of the lungs is clear.

No pleural effusion or pneumothorax.

Skeletal structures are unremarkable.
IMPRESSION: 1. No acute cardiopulmonary disease.
2. Atelectasis at both lung bases.

## 2018-11-22 ENCOUNTER — Telehealth: Payer: Self-pay | Admitting: Family Medicine

## 2018-11-22 NOTE — Telephone Encounter (Unsigned)
Copied from CRM [id number]. Topic: Quick Communication - See Telephone Encounter >> Nov 22, 2018 10:21 AM Debroah Loop wrote: CRM for notification. See Telephone encounter for: 11/22/18. Advised per Fleet Contras to send TE to [facility] regarding NO Show fee dated 04/19/2018 for an appointment with Dr. Caryl Never, however he was out of the office that day. Patient also received a $40 bill for 02/15/2018, but does not know what it was for. The patient was advised to contact the practice by the billing department. Please follow up with patient to rectify these matters.Thank you.

## 2018-12-04 NOTE — Telephone Encounter (Signed)
Email sent to charge correction to remove No Show fee. This has been completed.  Per billing dept the amount due for DOS 02/15/18 of $40 is the pt's copay amount.   LMTCB to advise.

## 2019-01-24 ENCOUNTER — Ambulatory Visit: Payer: BC Managed Care – PPO | Admitting: Family Medicine

## 2019-01-27 ENCOUNTER — Other Ambulatory Visit: Payer: Self-pay

## 2019-01-27 ENCOUNTER — Ambulatory Visit (INDEPENDENT_AMBULATORY_CARE_PROVIDER_SITE_OTHER): Payer: BC Managed Care – PPO | Admitting: Family Medicine

## 2019-01-27 DIAGNOSIS — I10 Essential (primary) hypertension: Secondary | ICD-10-CM

## 2019-01-27 NOTE — Progress Notes (Signed)
Patient ID: Katrina Callahan, female   DOB: July 27, 1975, 43 y.o.   MRN: [id number]  This visit type was conducted due to national recommendations for restrictions regarding the COVID-19 pandemic in an effort to limit this patient's exposure and mitigate transmission in our community.   Virtual Visit via Telephone Note  I connected with Linette Gunderson on 01/27/19 at 11:00 AM EDT by telephone and verified that I am speaking with the correct person using two identifiers.   I discussed the limitations, risks, security and privacy concerns of performing an evaluation and management service by telephone and the availability of in person appointments. I also discussed with the patient that there may be a patient responsible charge related to this service. The patient expressed understanding and agreed to proceed.  Location patient: home Location provider: work or home office Participants present for the call: patient, provider Patient did not have a visit in the prior 7 days to address this/these issue(s).   History of Present Illness: We will plan to connect with video visit but she has some technical difficulties.  This was converted to phone note.  This is a hypertension follow-up.  She takes losartan HCTZ.  She has been compliant with therapy.  She states she is walking for exercise and has lost 5 pounds recently and hopes to lose more.  She is walking up stairs in her apartment building.  Feels well overall.  No specific complaints.  Denies any recent headaches, dizziness, chest pain  She is looking at possible adoption soon   Observations/Objective: Patient sounds cheerful and well on the phone. I do not appreciate any SOB. Speech and thought processing are grossly intact. Patient reported vitals:  Assessment and Plan: Hypertension.  Controlled recently and should improve with her weight loss  -We discussed that she really needs some follow-up labs.  She would like to wait and hopefully  within the next 6 months bring in for physical and obtain lab work at that time -Continue regular exercise and weight loss efforts  Follow Up Instructions:  -As above   [id number] [id number] [id number] 11-20 [id number] 21-30 I did not refer this patient for an OV in the next 24 hours for this/these issue(s).  I discussed the assessment and treatment plan with the patient. The patient was provided an opportunity to ask questions and all were answered. The patient agreed with the plan and demonstrated an understanding of the instructions.   The patient was advised to call back or seek an in-person evaluation if the symptoms worsen or if the condition fails to improve as anticipated.  I provided 14 minutes of non-face-to-face time during this encounter.   Carolann Littler, MD

## 2019-04-07 ENCOUNTER — Ambulatory Visit: Payer: BC Managed Care – PPO | Admitting: Family Medicine

## 2019-04-07 ENCOUNTER — Other Ambulatory Visit: Payer: Self-pay

## 2019-04-07 ENCOUNTER — Encounter: Payer: Self-pay | Admitting: Family Medicine

## 2019-04-07 VITALS — BP 130/80 | HR 92 | Temp 97.6°F | Wt 285.7 lb

## 2019-04-07 DIAGNOSIS — R21 Rash and other nonspecific skin eruption: Secondary | ICD-10-CM | POA: Diagnosis not present

## 2019-04-07 DIAGNOSIS — G5621 Lesion of ulnar nerve, right upper limb: Secondary | ICD-10-CM

## 2019-04-07 NOTE — Progress Notes (Signed)
Subjective:     Patient ID: Katrina Callahan, female   DOB: 11/20/1975, 43 y.o.   MRN: [id number]  HPI [name] seen with the following 2 new issues  She states she used a lotion from bath and body about a week ago.  She noticed some itching afterwards especially right forearm and right side of neck.  She has left off the lotion since then.  No generalized urticaria.  Moderate pruritus.  Second issue is pain involving right upper extremity.  Location is medial elbow with some radiation toward the hand.  She has had occasional tingling of the fifth and possibly fourth digit.  No neck pain.  No weakness.  Pain is somewhat intermittent.  She started herself in a arm sling past couple days which has helped slightly.  She had pain with picking up objects.  She is right-hand dominant  Past Medical History:  Diagnosis Date  . Asthma   . Genital herpes    Past Surgical History:  Procedure Laterality Date  . BUNIONECTOMY  2003  . ECTOPIC PREGNANCY SURGERY  2001  . ECTOPIC PREGNANCY SURGERY  2001  . FOOT SURGERY  2003   both bunion and hammertoe    reports that she has never smoked. She has never used smokeless tobacco. No history on file for alcohol and drug. family history includes Cancer in her father and mother; Hyperlipidemia in her father, maternal grandfather, maternal grandmother, mother, paternal grandfather, and paternal grandmother; Hypertension in her brother, father, maternal grandfather, maternal grandmother, mother, paternal grandfather, paternal grandmother, and sister. Allergies  Allergen Reactions  . Bee Venom     Trouble breathing  . Erythromycin Shortness Of Breath  . Penicillins     anaphlysis  . Acyclovir And Related      Review of Systems  Constitutional: Negative for chills, fever and unexpected weight change.  Respiratory: Negative for cough and shortness of breath.   Cardiovascular: Negative for chest pain.  Musculoskeletal: Negative for neck pain.  Skin:  Positive for rash.  Neurological: Negative for weakness and numbness.  Hematological: Negative for adenopathy.       Objective:   Physical Exam Constitutional:      Appearance: Normal appearance.  Cardiovascular:     Rate and Rhythm: Normal rate and regular rhythm.  Pulmonary:     Effort: Pulmonary effort is normal.     Breath sounds: Normal breath sounds.  Musculoskeletal:     Right lower leg: No edema.     Left lower leg: No edema.     Comments: Full range of motion right elbow.  No lateral tenderness.  Minimal tenderness over medial epicondylar region.  Skin:    Findings: Rash present.     Comments: She has some small excoriations right elbow region laterally.  Right lower neck she has couple patches of hyperpigmented dry scaly rash.  Neurological:     Mental Status: She is alert.     Comments: Full strength upper extremities.  No evidence for muscle atrophy.  No interosseous muscle wasting.  Good strength with wrist flexion and extension.         Assessment:     #1 skin rash involving right lower neck and right elbow.  Question contact allergy.  #2 right elbow pain.  Suspect ulnar nerve entrapment.  She is describing some tingling sensation at the elbow and traveling down toward the fifth digit.  She does not have any evidence currently on exam to suggest significant ulnar nerve weakness.  Plan:     -Try over-the-counter 1% hydrocortisone cream to affected areas of rash.  She is aware that hyperpigmentation changes which can reflect inflammation may take longer to resolve  -We discussed her elbow issue.  Try to keep pressure off the elbow.  Consider elbow pads. -If symptoms persist consider nerve conduction velocities -We have also cautioned her not to use shoulder and arm immobilizer for more than 2 weeks to avoid risk of adhesive capsulitis of the shoulder -Reviewed signs and symptoms to watch for and reviewed findings that would suggest any ulnar nerve weakness or  progression  [name] MD [name]

## 2019-04-07 NOTE — Patient Instructions (Signed)
Suspect you have some ulnar nerve entrapment  Keep pressure off the elbow  Consider elbow pads to keep pressure off the nerve.    Follow up promptly for any hand weakness, progressive numbness, or worse pain.

## 2019-08-27 ENCOUNTER — Other Ambulatory Visit: Payer: Self-pay | Admitting: Family Medicine

## 2019-09-22 ENCOUNTER — Other Ambulatory Visit: Payer: Self-pay | Admitting: Family Medicine

## 2019-10-23 ENCOUNTER — Other Ambulatory Visit: Payer: Self-pay | Admitting: Family Medicine

## 2020-01-24 ENCOUNTER — Other Ambulatory Visit: Payer: Self-pay | Admitting: Family Medicine

## 2020-02-22 ENCOUNTER — Other Ambulatory Visit: Payer: Self-pay | Admitting: Family Medicine

## 2020-02-23 NOTE — Telephone Encounter (Signed)
Patient need to schedule an ov for more refills. 

## 2020-04-21 ENCOUNTER — Other Ambulatory Visit: Payer: Self-pay | Admitting: Family Medicine

## 2020-05-03 ENCOUNTER — Ambulatory Visit: Payer: BC Managed Care – PPO | Admitting: Family Medicine

## 2020-05-03 ENCOUNTER — Encounter: Payer: Self-pay | Admitting: Family Medicine

## 2020-05-03 ENCOUNTER — Other Ambulatory Visit: Payer: Self-pay

## 2020-05-03 VITALS — BP 124/78 | HR 71 | Temp 98.2°F | Ht 66.0 in | Wt 280.0 lb

## 2020-05-03 DIAGNOSIS — R7303 Prediabetes: Secondary | ICD-10-CM

## 2020-05-03 DIAGNOSIS — I10 Essential (primary) hypertension: Secondary | ICD-10-CM | POA: Diagnosis not present

## 2020-05-03 MED ORDER — LOSARTAN POTASSIUM-HCTZ 50-12.5 MG PO TABS: 1.0000 | ORAL_TABLET | Freq: Every day | ORAL | 3 refills | Status: DC

## 2020-05-03 NOTE — Progress Notes (Signed)
Established Patient Office Visit  Subjective:  Patient ID: Katrina Callahan, female    DOB: April 22, 1976  Age: 44 y.o. MRN: [id number]  CC:  Chief Complaint  Patient presents with  . Medication Refill    HPI Katrina Callahan presents for medical follow-up.  She has hypertension treated with losartan HCTZ.  Denies any recent headaches or dizziness.  Compliant with medications.  Blood pressures been well controlled when she has had this checked in the past.  She has lost a few pounds from last visit over a year ago.  She sees gynecologist regularly.  She has started exercising some with walking.  She has had prediabetes range blood sugars in the past.  Not aware of type 2 diabetes in either parent.  Her mother had apparently gastric cancer and father had prostate cancer.  Past Medical History:  Diagnosis Date  . Asthma   . Genital herpes     Past Surgical History:  Procedure Laterality Date  . BUNIONECTOMY  2003  . ECTOPIC PREGNANCY SURGERY  2001  . ECTOPIC PREGNANCY SURGERY  2001  . FOOT SURGERY  2003   both bunion and hammertoe    Family History  Problem Relation Age of Onset  . Hyperlipidemia Mother   . Hypertension Mother   . Cancer Mother        stomach  . Cancer Father        prostate  . Hyperlipidemia Father   . Hypertension Father   . Hypertension Sister   . Hypertension Brother   . Hypertension Maternal Grandmother   . Hyperlipidemia Maternal Grandmother   . Hypertension Maternal Grandfather   . Hyperlipidemia Maternal Grandfather   . Hypertension Paternal Grandmother   . Hyperlipidemia Paternal Grandmother   . Hypertension Paternal Grandfather   . Hyperlipidemia Paternal Grandfather     Social History   Socioeconomic History  . Marital status: Single    Spouse name: Not on file  . Number of children: Not on file  . Years of education: Not on file  . Highest education level: Not on file  Occupational History  . Not on file  Tobacco Use  .  Smoking status: Never Smoker  . Smokeless tobacco: Never Used  Vaping Use  . Vaping Use: Never used  Substance and Sexual Activity  . Alcohol use: Not on file  . Drug use: Not on file  . Sexual activity: Not on file  Other Topics Concern  . Not on file  Social History Narrative  . Not on file   Social Determinants of Health   Financial Resource Strain:   . Difficulty of Paying Living Expenses: Not on file  Food Insecurity:   . Worried About Programme researcher, broadcasting/film/video in the Last Year: Not on file  . Ran Out of Food in the Last Year: Not on file  Transportation Needs:   . Lack of Transportation (Medical): Not on file  . Lack of Transportation (Non-Medical): Not on file  Physical Activity:   . Days of Exercise per Week: Not on file  . Minutes of Exercise per Session: Not on file  Stress:   . Feeling of Stress : Not on file  Social Connections:   . Frequency of Communication with Friends and Family: Not on file  . Frequency of Social Gatherings with Friends and Family: Not on file  . Attends Religious Services: Not on file  . Active Member of Clubs or Organizations: Not on file  . Attends Club  or Organization Meetings: Not on file  . Marital Status: Not on file  Intimate Partner Violence:   . Fear of Current or Ex-Partner: Not on file  . Emotionally Abused: Not on file  . Physically Abused: Not on file  . Sexually Abused: Not on file    Outpatient Medications Prior to Visit  Medication Sig Dispense Refill  . albuterol (PROVENTIL HFA;VENTOLIN HFA) 108 (90 Base) MCG/ACT inhaler Inhale 2 puffs into the lungs every 4 (four) hours as needed for wheezing. 1 Inhaler 3  . ELDERBERRY PO Take by mouth. Once a day    . EPINEPHrine (EPIPEN) 0.3 mg/0.3 mL DEVI Inject 0.3 mLs (0.3 mg total) into the muscle once. 1 Device 2  . Multiple Vitamins-Minerals (WOMENS MULTIVITAMIN PO) Take by mouth.    Marland Kitchen OVER THE COUNTER MEDICATION Meal replacement - plant protein power - once daily    .  valACYclovir (VALTREX) 500 MG tablet valacyclovir 500 mg tablet  TAKE 1 TABLET BY MOUTH TWICE A DAY FOR 3 DAYS    . losartan-hydrochlorothiazide (HYZAAR) 50-12.5 MG tablet TAKE 1 TABLET BY MOUTH EVERY DAY 30 tablet 0  . OVER THE COUNTER MEDICATION tonalin 1000 mg - three times a day    . OVER THE COUNTER MEDICATION L-arnitine 3000 mg - up to twice daily (Patient not taking: Reported on 05/03/2020)     No facility-administered medications prior to visit.    Allergies  Allergen Reactions  . Bee Venom     Trouble breathing  . Erythromycin Shortness Of Breath  . Penicillins     anaphlysis  . Acyclovir And Related     ROS Review of Systems  Constitutional: Negative for fatigue and unexpected weight change.  Eyes: Negative for visual disturbance.  Respiratory: Negative for cough, chest tightness, shortness of breath and wheezing.   Cardiovascular: Negative for chest pain, palpitations and leg swelling.  Endocrine: Negative for polydipsia and polyuria.  Neurological: Negative for dizziness, seizures, syncope, weakness, light-headedness and headaches.      Objective:    Physical Exam Constitutional:      Appearance: She is well-developed.  Eyes:     Pupils: Pupils are equal, round, and reactive to light.  Neck:     Thyroid: No thyromegaly.     Vascular: No JVD.  Cardiovascular:     Rate and Rhythm: Normal rate and regular rhythm.     Heart sounds: No gallop.   Pulmonary:     Effort: Pulmonary effort is normal. No respiratory distress.     Breath sounds: Normal breath sounds. No wheezing or rales.  Musculoskeletal:     Cervical back: Neck supple.     Right lower leg: No edema.     Left lower leg: No edema.  Neurological:     Mental Status: She is alert.     BP 124/78 (BP Location: Left Arm, Patient Position: Sitting, Cuff Size: Large)   Pulse 71   Temp 98.2 F (36.8 C) (Oral)   Ht 5\' 6"  (1.676 m)   Wt 280 lb (127 kg)   SpO2 99%   BMI 45.19 kg/m  Wt Readings from  Last 3 Encounters:  05/03/20 280 lb (127 kg)  04/07/19 285 lb 11.2 oz (129.6 kg)  07/26/18 266 lb (120.7 kg)     Health Maintenance Due  Topic Date Due  . Hepatitis C Screening  Never done  . COVID-19 Vaccine (1) Never done  . HIV Screening  Never done  . TETANUS/TDAP  Never done  .  PAP SMEAR-Modifier  10/06/2014  . INFLUENZA VACCINE  02/01/2020    There are no preventive care reminders to display for this patient.  Lab Results  Component Value Date   TSH 1.21 08/09/2015   Lab Results  Component Value Date   WBC 12.3 (H) 09/20/2017   HGB 11.5 (L) 09/20/2017   HCT 34.5 (L) 09/20/2017   MCV 77.7 (L) 09/20/2017   PLT 351 09/20/2017   Lab Results  Component Value Date   NA 137 09/20/2017   K 3.9 09/20/2017   CO2 22 09/20/2017   GLUCOSE 100 (H) 09/20/2017   BUN 8 09/20/2017   CREATININE 0.94 09/20/2017   BILITOT 0.6 01/08/2012   ALKPHOS 83 01/08/2012   AST 12 01/08/2012   ALT 13 01/08/2012   PROT 6.9 01/08/2012   ALBUMIN 3.6 01/08/2012   CALCIUM 9.1 09/20/2017   ANIONGAP 10 09/20/2017   GFR 87.27 08/09/2015   Lab Results  Component Value Date   CHOL 160 01/08/2012   Lab Results  Component Value Date   HDL 63.80 01/08/2012   Lab Results  Component Value Date   LDLCALC 77 01/08/2012   Lab Results  Component Value Date   TRIG 94.0 01/08/2012   Lab Results  Component Value Date   CHOLHDL 3 01/08/2012   No results found for: HGBA1C    Assessment & Plan:   Problem List Items Addressed This Visit      Unprioritized   Essential hypertension - Primary   Relevant Medications   losartan-hydrochlorothiazide (HYZAAR) 50-12.5 MG tablet   Other Relevant Orders   Basic metabolic panel   Prediabetes   Relevant Orders   Hemoglobin A1c    -Refilled losartan HCTZ for 1 year -Ordered future labs as above.  Our lab had closed by the time her visit ended today.  She will reschedule to get those done on the 11th of this month -She is encouraged to continue  with regular exercise habits and try to lose further weight.  Meds ordered this encounter  Medications  . losartan-hydrochlorothiazide (HYZAAR) 50-12.5 MG tablet    Sig: Take 1 tablet by mouth daily.    Dispense:  90 tablet    Refill:  3    Follow-up: Return in about 1 year (around 05/03/2021).    Evelena Peat, MD

## 2020-05-13 ENCOUNTER — Other Ambulatory Visit (INDEPENDENT_AMBULATORY_CARE_PROVIDER_SITE_OTHER): Payer: BC Managed Care – PPO

## 2020-05-13 ENCOUNTER — Other Ambulatory Visit: Payer: Self-pay

## 2020-05-13 DIAGNOSIS — I10 Essential (primary) hypertension: Secondary | ICD-10-CM

## 2020-05-13 DIAGNOSIS — R7303 Prediabetes: Secondary | ICD-10-CM

## 2020-05-13 LAB — BASIC METABOLIC PANEL
BUN: 14 mg/dL (ref 6–23)
CO2: 27 mEq/L (ref 19–32)
Calcium: 8.7 mg/dL (ref 8.4–10.5)
Chloride: 105 mEq/L (ref 96–112)
Creatinine, Ser: 1.01 mg/dL (ref 0.40–1.20)
GFR: 66.73 mL/min (ref >60.00)
Glucose, Bld: 93 mg/dL (ref 70–99)
Potassium: 3.6 mEq/L (ref 3.5–5.1)
Sodium: 140 mEq/L (ref 135–145)

## 2020-05-13 LAB — HEMOGLOBIN A1C: Hgb A1c MFr Bld: 6.2 % (ref 4.6–6.5)

## 2020-05-13 NOTE — Addendum Note (Signed)
Addended by: Lerry Liner on: 05/13/2020 08:17 AM   Modules accepted: Orders

## 2021-02-11 ENCOUNTER — Other Ambulatory Visit: Payer: Self-pay

## 2021-02-14 ENCOUNTER — Other Ambulatory Visit: Payer: Self-pay

## 2021-02-14 ENCOUNTER — Ambulatory Visit (INDEPENDENT_AMBULATORY_CARE_PROVIDER_SITE_OTHER): Payer: BC Managed Care – PPO | Admitting: Family Medicine

## 2021-02-14 ENCOUNTER — Encounter: Payer: Self-pay | Admitting: Family Medicine

## 2021-02-14 VITALS — BP 130/70 | HR 76 | Temp 97.8°F | Ht 66.0 in | Wt 280.9 lb

## 2021-02-14 DIAGNOSIS — Z0000 Encounter for general adult medical examination without abnormal findings: Secondary | ICD-10-CM | POA: Diagnosis not present

## 2021-02-14 LAB — CBC WITH DIFFERENTIAL/PLATELET
Basophils Absolute: 0 10*3/uL (ref 0.0–0.1)
Basophils Relative: 0.6 % (ref 0.0–3.0)
Eosinophils Absolute: 0.2 10*3/uL (ref 0.0–0.7)
Eosinophils Relative: 2.8 % (ref 0.0–5.0)
HCT: 29 % — ABNORMAL LOW (ref 36.0–46.0)
Hemoglobin: 9.3 g/dL — ABNORMAL LOW (ref 12.0–15.0)
Lymphocytes Relative: 36.4 % (ref 12.0–46.0)
Lymphs Abs: 2.7 10*3/uL (ref 0.7–4.0)
MCHC: 32.2 g/dL (ref 30.0–36.0)
MCV: 73 fl — ABNORMAL LOW (ref 78.0–100.0)
Monocytes Absolute: 0.6 10*3/uL (ref 0.1–1.0)
Monocytes Relative: 8 % (ref 3.0–12.0)
Neutro Abs: 3.8 10*3/uL (ref 1.4–7.7)
Neutrophils Relative %: 52.2 % (ref 43.0–77.0)
Platelets: 384 10*3/uL (ref 150.0–400.0)
RBC: 3.98 Mil/uL (ref 3.87–5.11)
RDW: 16.3 % — ABNORMAL HIGH (ref 11.5–15.5)
WBC: 7.3 10*3/uL (ref 4.0–10.5)

## 2021-02-14 LAB — TSH: TSH: 1.58 u[IU]/mL (ref 0.35–5.50)

## 2021-02-14 LAB — BASIC METABOLIC PANEL
BUN: 15 mg/dL (ref 6–23)
CO2: 27 mEq/L (ref 19–32)
Calcium: 9.3 mg/dL (ref 8.4–10.5)
Chloride: 106 mEq/L (ref 96–112)
Creatinine, Ser: 1.06 mg/dL (ref 0.40–1.20)
GFR: 62.64 mL/min (ref >60.00)
Glucose, Bld: 85 mg/dL (ref 70–99)
Potassium: 4 mEq/L (ref 3.5–5.1)
Sodium: 141 mEq/L (ref 135–145)

## 2021-02-14 LAB — LIPID PANEL
Cholesterol: 176 mg/dL (ref 0–200)
HDL: 64.5 mg/dL (ref >39.00)
LDL Cholesterol: 101 mg/dL — ABNORMAL HIGH (ref 0–99)
NonHDL: 111.89
Total CHOL/HDL Ratio: 3
Triglycerides: 53 mg/dL (ref 0.0–149.0)
VLDL: 10.6 mg/dL (ref 0.0–40.0)

## 2021-02-14 LAB — HEPATIC FUNCTION PANEL
ALT: 8 U/L (ref 0–35)
AST: 10 U/L (ref 0–37)
Albumin: 3.9 g/dL (ref 3.5–5.2)
Alkaline Phosphatase: 101 U/L (ref 39–117)
Bilirubin, Direct: 0.1 mg/dL (ref 0.0–0.3)
Total Bilirubin: 0.5 mg/dL (ref 0.2–1.2)
Total Protein: 6.8 g/dL (ref 6.0–8.3)

## 2021-02-14 LAB — HEMOGLOBIN A1C: Hgb A1c MFr Bld: 6.1 % (ref 4.6–6.5)

## 2021-02-14 NOTE — Progress Notes (Signed)
Established Patient Office Visit  Subjective:  Patient ID: Katrina Callahan, female    DOB: 04/09/1976  Age: 45 y.o. MRN: [id number]  CC:  Chief Complaint  Patient presents with   Annual Exam    Has a physical for to be filled out    HPI [name] presents for physical exam.  She sees gynecologist yearly.  She gets Pap smears and mammograms through her GYN.  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  She is looking forward to that.  She is currently not using any sort of weight loss program.  She does have questions regarding possible medication options.  She does drink some colas and also Snapple usually couple times per week.  Fair number of high starch food choices.  No regular exercise.  Health maintenance reviewed  -Patient states that her tetanus is up-to-date though we do not have actual date. -Has never had colonoscopy and would like to go ahead and schedule. -She has a form to complete for teaching which does request PPD.  She is low risk. -Getting regular Pap smears and mammograms through GYN  Family history-maternal grandmother with diabetes.  Her mother had gastric cancer.  Father had prostate cancer but apparently is in remission.  Both parents with hypertension history and hyperlipidemia.  She is an only child.  Social history-she is single.  No children.  Works in Programme researcher, broadcasting/film/video.  Never smoked.  No alcohol.   Past Medical History:  Diagnosis Date   Asthma    Genital herpes    Hypertension     Past Surgical History:  Procedure Laterality Date   BUNIONECTOMY  2003   ECTOPIC PREGNANCY SURGERY  2001   ECTOPIC PREGNANCY SURGERY  2001   FOOT SURGERY  2003   both bunion and hammertoe    Family History  Problem Relation Age of Onset   Hyperlipidemia Mother    Hypertension Mother    Cancer Mother        stomach   Cancer Father        prostate    Hyperlipidemia Father    Hypertension Father    Hypertension Maternal Grandmother    Hyperlipidemia Maternal Grandmother    Hypertension Maternal Grandfather    Hyperlipidemia Maternal Grandfather    Hypertension Paternal Grandmother    Hyperlipidemia Paternal Grandmother    Hypertension Paternal Grandfather    Hyperlipidemia Paternal Grandfather     Social History   Socioeconomic History   Marital status: Single    Spouse name: Not on file   Number of children: Not on file   Years of education: Not on file   Highest education level: Not on file  Occupational History   Not on file  Tobacco Use   Smoking status: Never   Smokeless tobacco: Never  Vaping Use   Vaping Use: Never used  Substance and Sexual Activity   Alcohol use: Not on file   Drug use: Not on file   Sexual activity: Not on file  Other Topics Concern   Not on file  Social History Narrative   Not on file   Social Determinants of Health   Financial Resource Strain: Not on file  Food Insecurity: Not on file  Transportation Needs: Not on file  Physical Activity: Not on file  Stress: Not on file  Social Connections: Not on file  Intimate Partner Violence:  Not on file    Outpatient Medications Prior to Visit  Medication Sig Dispense Refill   albuterol (PROVENTIL HFA;VENTOLIN HFA) 108 (90 Base) MCG/ACT inhaler Inhale 2 puffs into the lungs every 4 (four) hours as needed for wheezing. 1 Inhaler 3   ELDERBERRY PO Take by mouth. Once a day     EPINEPHrine (EPIPEN) 0.3 mg/0.3 mL DEVI Inject 0.3 mLs (0.3 mg total) into the muscle once. 1 Device 2   losartan-hydrochlorothiazide (HYZAAR) 50-12.5 MG tablet Take 1 tablet by mouth daily. 90 tablet 3   Multiple Vitamins-Minerals (WOMENS MULTIVITAMIN PO) Take by mouth.     OVER THE COUNTER MEDICATION Meal replacement - plant protein power - once daily     OVER THE COUNTER MEDICATION tonalin 1000 mg - three times a day     OVER THE COUNTER MEDICATION L-arnitine 3000 mg  - up to twice daily     valACYclovir (VALTREX) 500 MG tablet valacyclovir 500 mg tablet  TAKE 1 TABLET BY MOUTH TWICE A DAY FOR 3 DAYS     No facility-administered medications prior to visit.    Allergies  Allergen Reactions   Bee Venom     Trouble breathing   Erythromycin Shortness Of Breath   Penicillins     anaphlysis   Acyclovir And Related     ROS Review of Systems  Constitutional:  Negative for activity change, appetite change, fatigue, fever and unexpected weight change.  HENT:  Negative for ear pain, hearing loss, sore throat and trouble swallowing.   Eyes:  Negative for visual disturbance.  Respiratory:  Negative for cough and shortness of breath.   Cardiovascular:  Negative for chest pain and palpitations.  Gastrointestinal:  Negative for abdominal pain, blood in stool, constipation and diarrhea.  Endocrine: Negative for polydipsia and polyuria.  Genitourinary:  Negative for dysuria and hematuria.  Musculoskeletal:  Negative for arthralgias, back pain and myalgias.  Skin:  Negative for rash.  Neurological:  Negative for dizziness, syncope and headaches.  Hematological:  Negative for adenopathy.  Psychiatric/Behavioral:  Negative for confusion and dysphoric mood.      Objective:    Physical Exam Constitutional:      Appearance: She is well-developed.  HENT:     Head: Normocephalic and atraumatic.  Eyes:     Pupils: Pupils are equal, round, and reactive to light.  Neck:     Thyroid: No thyromegaly.  Cardiovascular:     Rate and Rhythm: Normal rate and regular rhythm.     Heart sounds: Normal heart sounds. No murmur heard. Pulmonary:     Effort: No respiratory distress.     Breath sounds: Normal breath sounds. No wheezing or rales.  Abdominal:     General: Bowel sounds are normal. There is no distension.     Palpations: Abdomen is soft. There is no mass.     Tenderness: There is no abdominal tenderness. There is no guarding or rebound.  Musculoskeletal:         General: Normal range of motion.     Cervical back: Normal range of motion and neck supple.     Right lower leg: No edema.     Left lower leg: No edema.  Lymphadenopathy:     Cervical: No cervical adenopathy.  Skin:    Findings: No rash.  Neurological:     Mental Status: She is alert and oriented to person, place, and time.     Cranial Nerves: No cranial nerve deficit.  Psychiatric:  Behavior: Behavior normal.        Thought Content: Thought content normal.        Judgment: Judgment normal.    BP 130/70 (BP Location: Left Arm, Patient Position: Sitting, Cuff Size: Normal)   Pulse 76   Temp 97.8 F (36.6 C) (Oral)   Ht 5\' 6"  (1.676 m)   Wt 280 lb 14.4 oz (127.4 kg)   SpO2 98%   BMI 45.34 kg/m  Wt Readings from Last 3 Encounters:  02/14/21 280 lb 14.4 oz (127.4 kg)  05/03/20 280 lb (127 kg)  04/07/19 285 lb 11.2 oz (129.6 kg)     Health Maintenance Due  Topic Date Due   COVID-19 Vaccine (1) Never done   HIV Screening  Never done   TETANUS/TDAP  Never done   COLONOSCOPY (Pts 45-66yrs Insurance coverage will need to be confirmed)  Never done   INFLUENZA VACCINE  01/31/2021    There are no preventive care reminders to display for this patient.  Lab Results  Component Value Date   TSH 1.21 08/09/2015   Lab Results  Component Value Date   WBC 12.3 (H) 09/20/2017   HGB 11.5 (L) 09/20/2017   HCT 34.5 (L) 09/20/2017   MCV 77.7 (L) 09/20/2017   PLT 351 09/20/2017   Lab Results  Component Value Date   NA 140 05/13/2020   K 3.6 05/13/2020   CO2 27 05/13/2020   GLUCOSE 93 05/13/2020   BUN 14 05/13/2020   CREATININE 1.01 05/13/2020   BILITOT 0.6 01/08/2012   ALKPHOS 83 01/08/2012   AST 12 01/08/2012   ALT 13 01/08/2012   PROT 6.9 01/08/2012   ALBUMIN 3.6 01/08/2012   CALCIUM 8.7 05/13/2020   ANIONGAP 10 09/20/2017   GFR 66.73 05/13/2020   Lab Results  Component Value Date   CHOL 160 01/08/2012   Lab Results  Component Value Date   HDL 63.80  01/08/2012   Lab Results  Component Value Date   LDLCALC 77 01/08/2012   Lab Results  Component Value Date   TRIG 94.0 01/08/2012   Lab Results  Component Value Date   CHOLHDL 3 01/08/2012   Lab Results  Component Value Date   HGBA1C 6.2 05/13/2020      Assessment & Plan:   Problem List Items Addressed This Visit   None Visit Diagnoses     Physical exam    -  Primary   Relevant Orders   Basic metabolic panel   Lipid panel   CBC with Differential/Platelet   TSH   Hepatic function panel   Hemoglobin A1c   Ambulatory referral to Gastroenterology   TB Skin Test (Completed)     Patient has morbid obesity.  Past history of prediabetes range blood sugars and very high risk of becoming full-fledged diabetic.  Also very high risk of developing hypertension although current blood pressure relatively stable.  We discussed the following health maintenance issues  -Strongly advocate she try to lose some weight.  Start by making good dietary choices with reducing high glycemic and starchy foods -Check follow-up labs as above including A1c -If A1c climbing would consider medication such as GLP-1 if we can get insurance coverage -Set up colonoscopy -PPD placed for patient and she will return in 48 hours to have this read -She will continue with GYN follow-up for Pap smears and mammograms  No orders of the defined types were placed in this encounter.   Follow-up: No follow-ups on file.  Carolann Littler, MD

## 2021-02-14 NOTE — Patient Instructions (Signed)
Be sure to return in 48-72 hours to have PPD test read.

## 2021-02-15 ENCOUNTER — Telehealth: Payer: Self-pay

## 2021-02-15 NOTE — Telephone Encounter (Signed)
Reviewed lab results and Dr Caryl Never advise with pt verbalized understanding

## 2021-02-18 ENCOUNTER — Encounter: Payer: Self-pay | Admitting: Family Medicine

## 2021-02-21 ENCOUNTER — Telehealth: Payer: Self-pay | Admitting: Family Medicine

## 2021-02-21 ENCOUNTER — Telehealth: Payer: Self-pay

## 2021-02-21 NOTE — Telephone Encounter (Signed)
Spoke with the patient. She is aware of Dr. Mar Daring message and will contact her insurance company to see if ozempic is covered. She will call back with more information.

## 2021-02-21 NOTE — Telephone Encounter (Signed)
Patient is wanting a prescription for a drug for diabetics that will suppress her food cravings. She states that her and Dr. Caryl Never discussed this during her visit.  Patient uses [id number] - [place], [place] - [address]  Please advise.

## 2021-02-21 NOTE — Telephone Encounter (Signed)
Pt stated that she contacted her insurance company regarding Ozempic and was advised that the lowest dosage was approved by her insurance.   Pt stated Lillia Abed is aware of this matter.

## 2021-02-25 NOTE — Telephone Encounter (Signed)
Spoke with the patient. She is aware of Dr. Lucie Leather message. She would like to know if there are any alternative medications she could try in the mean time. Patient is aware that her insurance may not cover some of the alternative medications for this and would like the names of possible alternatives so that she can check with her insurance.

## 2021-02-28 NOTE — Telephone Encounter (Signed)
Left message for patient to call back  

## 2021-03-01 NOTE — Telephone Encounter (Signed)
Left message for patient to call back  

## 2021-03-02 NOTE — Telephone Encounter (Signed)
Left message for patient to call back  

## 2021-03-09 NOTE — Telephone Encounter (Signed)
ATC, unable to leave a voice mail. Unable to reach the patient. Message will be closed.  °

## 2021-04-23 ENCOUNTER — Other Ambulatory Visit: Payer: Self-pay | Admitting: Family Medicine

## 2022-08-12 ENCOUNTER — Other Ambulatory Visit: Payer: Self-pay | Admitting: Family Medicine

## 2022-09-01 ENCOUNTER — Other Ambulatory Visit: Payer: Self-pay | Admitting: Family Medicine

## 2022-09-01 ENCOUNTER — Telehealth: Payer: Self-pay | Admitting: Family Medicine

## 2022-09-01 MED ORDER — LOSARTAN POTASSIUM-HCTZ 50-12.5 MG PO TABS: 1.0000 | ORAL_TABLET | Freq: Every day | ORAL | 0 refills | Status: DC

## 2022-09-01 NOTE — Telephone Encounter (Signed)
Rx sent 

## 2022-09-01 NOTE — Telephone Encounter (Signed)
Pt called to FU on this refill stating she has been without for 2 wks.

## 2022-09-01 NOTE — Telephone Encounter (Signed)
Pt has cpe sch for 09-06-2022 and would like a refill on losartan-hydrochlorothiazide (HYZAAR) 50-12.5 MG tablet  [id number] [place], [place] [address] [place]: [phone] Fax: [phone]

## 2022-09-04 ENCOUNTER — Ambulatory Visit: Payer: BC Managed Care – PPO | Admitting: Family Medicine

## 2022-09-06 ENCOUNTER — Ambulatory Visit (INDEPENDENT_AMBULATORY_CARE_PROVIDER_SITE_OTHER): Payer: BC Managed Care – PPO | Admitting: Family Medicine

## 2022-09-06 ENCOUNTER — Encounter: Payer: Self-pay | Admitting: Family Medicine

## 2022-09-06 VITALS — BP 154/90 | HR 65 | Temp 97.8°F | Ht 65.35 in | Wt 270.4 lb

## 2022-09-06 DIAGNOSIS — Z23 Encounter for immunization: Secondary | ICD-10-CM

## 2022-09-06 DIAGNOSIS — R7303 Prediabetes: Secondary | ICD-10-CM

## 2022-09-06 DIAGNOSIS — Z0000 Encounter for general adult medical examination without abnormal findings: Secondary | ICD-10-CM | POA: Diagnosis not present

## 2022-09-06 DIAGNOSIS — Z1211 Encounter for screening for malignant neoplasm of colon: Secondary | ICD-10-CM | POA: Diagnosis not present

## 2022-09-06 LAB — LIPID PANEL
Cholesterol: 177 mg/dL (ref 0–200)
HDL: 66.9 mg/dL (ref >39.00)
LDL Cholesterol: 100 mg/dL — ABNORMAL HIGH (ref 0–99)
NonHDL: 110.01
Total CHOL/HDL Ratio: 3
Triglycerides: 50 mg/dL (ref 0.0–149.0)
VLDL: 10 mg/dL (ref 0.0–40.0)

## 2022-09-06 LAB — CBC WITH DIFFERENTIAL/PLATELET
Basophils Absolute: 0 10*3/uL (ref 0.0–0.1)
Basophils Relative: 0.6 % (ref 0.0–3.0)
Eosinophils Absolute: 0.2 10*3/uL (ref 0.0–0.7)
Eosinophils Relative: 2.4 % (ref 0.0–5.0)
HCT: 34.3 % — ABNORMAL LOW (ref 36.0–46.0)
Hemoglobin: 11.1 g/dL — ABNORMAL LOW (ref 12.0–15.0)
Lymphocytes Relative: 42 % (ref 12.0–46.0)
Lymphs Abs: 2.8 10*3/uL (ref 0.7–4.0)
MCHC: 32.3 g/dL (ref 30.0–36.0)
MCV: 73.5 fl — ABNORMAL LOW (ref 78.0–100.0)
Monocytes Absolute: 0.5 10*3/uL (ref 0.1–1.0)
Monocytes Relative: 6.9 % (ref 3.0–12.0)
Neutro Abs: 3.3 10*3/uL (ref 1.4–7.7)
Neutrophils Relative %: 48.1 % (ref 43.0–77.0)
Platelets: 390 10*3/uL (ref 150.0–400.0)
RBC: 4.67 Mil/uL (ref 3.87–5.11)
RDW: 16.8 % — ABNORMAL HIGH (ref 11.5–15.5)
WBC: 6.8 10*3/uL (ref 4.0–10.5)

## 2022-09-06 LAB — BASIC METABOLIC PANEL
BUN: 13 mg/dL (ref 6–23)
CO2: 24 mEq/L (ref 19–32)
Calcium: 9.7 mg/dL (ref 8.4–10.5)
Chloride: 102 mEq/L (ref 96–112)
Creatinine, Ser: 0.97 mg/dL (ref 0.40–1.20)
GFR: 68.92 mL/min (ref >60.00)
Glucose, Bld: 76 mg/dL (ref 70–99)
Potassium: 3.4 mEq/L — ABNORMAL LOW (ref 3.5–5.1)
Sodium: 138 mEq/L (ref 135–145)

## 2022-09-06 LAB — HEPATIC FUNCTION PANEL
ALT: 7 U/L (ref 0–35)
AST: 11 U/L (ref 0–37)
Albumin: 4.1 g/dL (ref 3.5–5.2)
Alkaline Phosphatase: 103 U/L (ref 39–117)
Bilirubin, Direct: 0.2 mg/dL (ref 0.0–0.3)
Total Bilirubin: 0.7 mg/dL (ref 0.2–1.2)
Total Protein: 7.8 g/dL (ref 6.0–8.3)

## 2022-09-06 LAB — HEMOGLOBIN A1C: Hgb A1c MFr Bld: 5.9 % (ref 4.6–6.5)

## 2022-09-06 MED ORDER — LOSARTAN POTASSIUM-HCTZ 100-25 MG PO TABS: 1.0000 | ORAL_TABLET | Freq: Every day | ORAL | 3 refills | Status: DC

## 2022-09-06 MED ORDER — LOSARTAN POTASSIUM-HCTZ 50-12.5 MG PO TABS: 1.0000 | ORAL_TABLET | Freq: Every day | ORAL | 3 refills | Status: DC

## 2022-09-06 NOTE — Progress Notes (Signed)
Established Patient Office Visit  Subjective   Patient ID: Katrina Callahan, female    DOB: 29-Dec-1975  Age: 47 y.o. MRN: [id number]  Chief Complaint  Patient presents with   Annual Exam    HPI   Katrina Callahan is seen for physical exam.  She sees gynecologist and apparently had recent IUD placed for some menorrhagia.  She is scheduled to see them again tomorrow.  She is also getting mammograms through her GYN office.  She has hypertension but states she ran out of her Hyzaar about 2 or 3 weeks ago.  She had doubled this up and is taking twice daily and blood pressures were better controlled.  She has lost about 10 to 13 pounds recently due to her efforts.  Health maintenance reviewed  -She is age 52 with no history of screening colonoscopy.  We have made referral for this previously but she never went -Needs tetanus booster -No history of hepatitis C screening but low risk  Family history-maternal grandmother with diabetes.  Her mother had gastric cancer.  Father had prostate cancer but apparently is in remission.  Both parents with hypertension history and hyperlipidemia.  She is an only child.   Social history-she is single.  No children.  Works in Scientist, physiological.  Never smoked.  No alcohol.  Past Medical History:  Diagnosis Date   Asthma    Genital herpes    Hypertension    Past Surgical History:  Procedure Laterality Date   BUNIONECTOMY  2003   ECTOPIC PREGNANCY SURGERY  2001   ECTOPIC PREGNANCY SURGERY  2001   FOOT SURGERY  2003   both bunion and hammertoe    reports that she has never smoked. She has never used smokeless tobacco. No history on file for alcohol use and drug use. family history includes Cancer in her father and mother; Hyperlipidemia in her father, maternal grandfather, maternal grandmother, mother, paternal grandfather, and paternal grandmother; Hypertension in her father, maternal grandfather, maternal grandmother, mother, paternal grandfather, and paternal  grandmother. Allergies  Allergen Reactions   Bee Venom     Trouble breathing   Erythromycin Shortness Of Breath   Penicillins     anaphlysis   Acyclovir And Related     Review of Systems  Constitutional:  Negative for chills, fever, malaise/fatigue and weight loss.  HENT:  Negative for hearing loss.   Eyes:  Negative for blurred vision and double vision.  Respiratory:  Negative for cough and shortness of breath.   Cardiovascular:  Negative for chest pain, palpitations and leg swelling.  Gastrointestinal:  Negative for abdominal pain, blood in stool, constipation and diarrhea.  Genitourinary:  Negative for dysuria.  Skin:  Negative for rash.  Neurological:  Negative for dizziness, speech change, seizures, loss of consciousness and headaches.  Psychiatric/Behavioral:  Negative for depression.       Objective:     BP (!) 154/90 (BP Location: Left Arm, Patient Position: Sitting, Cuff Size: Large)   Pulse 65   Temp 97.8 F (36.6 C) (Oral)   Ht 5' 5.35" (1.66 m)   Wt 270 lb 6.4 oz (122.7 kg)   SpO2 99%   BMI 44.51 kg/m  BP Readings from Last 3 Encounters:  09/06/22 (!) 154/90  02/14/21 130/70  05/03/20 124/78   Wt Readings from Last 3 Encounters:  09/06/22 270 lb 6.4 oz (122.7 kg)  02/14/21 280 lb 14.4 oz (127.4 kg)  05/03/20 280 lb (127 kg)      Physical Exam Vitals reviewed.  Constitutional:      General: She is not in acute distress.    Appearance: She is well-developed.  HENT:     Head: Normocephalic and atraumatic.  Eyes:     Pupils: Pupils are equal, round, and reactive to light.  Neck:     Thyroid: No thyromegaly.  Cardiovascular:     Rate and Rhythm: Normal rate and regular rhythm.     Heart sounds: Normal heart sounds. No murmur heard. Pulmonary:     Effort: No respiratory distress.     Breath sounds: Normal breath sounds. No wheezing or rales.  Abdominal:     General: Bowel sounds are normal. There is no distension.     Palpations: Abdomen is  soft. There is no mass.     Tenderness: There is no abdominal tenderness. There is no guarding or rebound.  Musculoskeletal:        General: Normal range of motion.     Cervical back: Normal range of motion and neck supple.  Lymphadenopathy:     Cervical: No cervical adenopathy.  Skin:    Findings: No rash.  Neurological:     Mental Status: She is alert and oriented to person, place, and time.     Cranial Nerves: No cranial nerve deficit.  Psychiatric:        Behavior: Behavior normal.        Thought Content: Thought content normal.        Judgment: Judgment normal.      No results found for any visits on 09/06/22.    The 10-year ASCVD risk score (Arnett DK, et al., 2019) is: 4.5%    Assessment & Plan:   Problem List Items Addressed This Visit       Unprioritized   Prediabetes   Relevant Orders   Hemoglobin A1c   Other Visit Diagnoses     Colon cancer screening    -  Primary   Relevant Orders   Ambulatory referral to Gastroenterology   Physical exam       Relevant Orders   Basic metabolic panel   Lipid panel   CBC with Differential/Platelet   Hepatic function panel   Hep C Antibody     Katrina Callahan has history of hypertension which is poorly controlled but she has been out of her medication for a few weeks.  She has history of iron deficiency anemia with menorrhagia and had recent IUD placed per GYN.  We discussed several health maintenance issues as below  -Strongly encouraged her to lose some weight -Switch losartan HCTZ to 100/25 mg 1 daily -Recommend 80-monthfollow-up to reassess blood pressure -Set up initial screening colonoscopy -Check labs as above.  Include A1c with prior history of prediabetes.  Also check hepatitis C though she is low risk -Tdap booster given  No follow-ups on file.    BCarolann Littler MD

## 2022-09-06 NOTE — Addendum Note (Signed)
Addended by: Nilda Riggs on: 09/06/2022 03:11 PM   Modules accepted: Orders

## 2022-09-07 LAB — HEPATITIS C ANTIBODY: Hepatitis C Ab: NONREACTIVE

## 2023-08-27 ENCOUNTER — Other Ambulatory Visit: Payer: Self-pay | Admitting: Family Medicine

## 2023-11-27 ENCOUNTER — Other Ambulatory Visit: Payer: Self-pay | Admitting: Family Medicine

## 2024-01-22 ENCOUNTER — Other Ambulatory Visit: Payer: Self-pay | Admitting: Family Medicine

## 2024-03-01 ENCOUNTER — Other Ambulatory Visit: Payer: Self-pay | Admitting: Family Medicine

## 2024-06-18 ENCOUNTER — Encounter: Payer: Self-pay | Admitting: Family Medicine

## 2024-06-18 ENCOUNTER — Ambulatory Visit: Payer: Self-pay | Admitting: Family Medicine

## 2024-06-18 VITALS — BP 150/100 | HR 96 | Temp 98.0°F | Wt 292.2 lb

## 2024-06-18 DIAGNOSIS — R7303 Prediabetes: Secondary | ICD-10-CM | POA: Diagnosis not present

## 2024-06-18 DIAGNOSIS — I10 Essential (primary) hypertension: Secondary | ICD-10-CM | POA: Diagnosis not present

## 2024-06-18 DIAGNOSIS — Z23 Encounter for immunization: Secondary | ICD-10-CM

## 2024-06-18 LAB — BASIC METABOLIC PANEL WITH GFR
BUN: 13 mg/dL (ref 6–23)
CO2: 28 meq/L (ref 19–32)
Calcium: 8.9 mg/dL (ref 8.4–10.5)
Chloride: 106 meq/L (ref 96–112)
Creatinine, Ser: 0.91 mg/dL (ref 0.40–1.20)
GFR: 73.48 mL/min (ref >60.00)
Glucose, Bld: 86 mg/dL (ref 70–99)
Potassium: 3.6 meq/L (ref 3.5–5.1)
Sodium: 141 meq/L (ref 135–145)

## 2024-06-18 LAB — HEMOGLOBIN A1C: Hgb A1c MFr Bld: 5.8 % (ref 4.6–6.5)

## 2024-06-18 MED ORDER — LOSARTAN POTASSIUM-HCTZ 100-25 MG PO TABS: 1.0000 | ORAL_TABLET | Freq: Every day | ORAL | 3 refills | Status: AC

## 2024-06-18 NOTE — Addendum Note (Signed)
 Addended by: METTA KRISTEN CROME on: 06/18/2024 07:27 AM   Modules accepted: Orders

## 2024-06-18 NOTE — Progress Notes (Signed)
 Established Patient Office Visit  Subjective   Patient ID: Katrina Callahan, female    DOB: [birth year]  Age: 48 y.o. MRN: [id number]  Chief Complaint  Patient presents with   Medication Refill    HPI   Katrina Callahan is here for medical follow-up.  She has longstanding history of hypertension.  History of poor compliance at times.  She unfortunately ran out of her medicine a couple days ago.  Takes losartan /HCTZ 100/25 mg 1 daily.  Denies any recent dizziness or headaches.  She states she had been compliant with medication until couple of days ago running out.  She has gained some weight since last year.  She hopes to start a new gym in January.  Strong family history of type 2 diabetes and hypertension.  She has history of prediabetes range blood sugar with last A1c 5.9%.  Past Medical History:  Diagnosis Date   Asthma    Genital herpes    Hypertension    Past Surgical History:  Procedure Laterality Date   BUNIONECTOMY  2003   ECTOPIC PREGNANCY SURGERY  2001   ECTOPIC PREGNANCY SURGERY  2001   FOOT SURGERY  2003   both bunion and hammertoe    reports that she has never smoked. She has never used smokeless tobacco. No history on file for alcohol use and drug use. family history includes Cancer in her father and mother; Hyperlipidemia in her father, maternal grandfather, maternal grandmother, mother, paternal grandfather, and paternal grandmother; Hypertension in her father, maternal grandfather, maternal grandmother, mother, paternal grandfather, and paternal grandmother. Allergies[1]  Review of Systems  Constitutional:  Negative for malaise/fatigue.  Eyes:  Negative for blurred vision.  Respiratory:  Negative for shortness of breath.   Cardiovascular:  Negative for chest pain.  Neurological:  Negative for dizziness, weakness and headaches.      Objective:     BP (!) 150/100   Pulse 96   Temp 98 F (36.7 C) (Oral)   Wt 292 lb 3.2 oz (132.5 kg)   SpO2 98%   BMI 48.10  kg/m  BP Readings from Last 3 Encounters:  06/18/24 (!) 150/100  09/06/22 (!) 154/90  02/14/21 130/70   Wt Readings from Last 3 Encounters:  06/18/24 292 lb 3.2 oz (132.5 kg)  09/06/22 270 lb 6.4 oz (122.7 kg)  02/14/21 280 lb 14.4 oz (127.4 kg)      Physical Exam Vitals reviewed.  Constitutional:      Appearance: Normal appearance. She is well-developed.  Eyes:     Pupils: Pupils are equal, round, and reactive to light.  Neck:     Thyroid : No thyromegaly.     Vascular: No JVD.  Cardiovascular:     Rate and Rhythm: Normal rate and regular rhythm.     Heart sounds:     No gallop.  Pulmonary:     Effort: Pulmonary effort is normal. No respiratory distress.     Breath sounds: Normal breath sounds. No wheezing or rales.  Musculoskeletal:     Cervical back: Neck supple.     Right lower leg: No edema.     Left lower leg: No edema.  Neurological:     Mental Status: She is alert.      No results found for any visits on 06/18/24.    The 10-year ASCVD risk score (Arnett DK, et al., 2019) is: 4.5%    Assessment & Plan:   Problem List Items Addressed This Visit       Unprioritized  Essential hypertension - Primary   Relevant Medications   losartan -hydrochlorothiazide (HYZAAR) 100-25 MG tablet   Other Relevant Orders   Basic metabolic panel with GFR   Prediabetes   Relevant Orders   Hemoglobin A1c  Patient has hypertension currently poorly controlled but out of medicine for the past 2 days.  We refilled her losartan /HCTZ.  Check basic metabolic panel.  Handout on DASH diet given.  Set up office follow-up in 3 months to reassess blood pressure on medication to make sure she is at goal.  Also rechecking A1c today.  We discussed healthy strategies for weight loss.  Offered referral to health and weight management but she declines.  She prefers to try to lose weight on her own.  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  She consents to getting this  today.  Return in about 3 months (around 09/16/2024).    Wolm Scarlet, MD     [1]  Allergies Allergen Reactions   Bee Venom     Trouble breathing   Erythromycin Shortness Of Breath   Penicillins     anaphlysis   Acyclovir And Related

## 2024-09-17 ENCOUNTER — Ambulatory Visit: Admitting: Family Medicine
